# Patient Record
Sex: Male | Born: 1940 | Race: White | Hispanic: No | Marital: Married | Smoking: Former smoker
Health system: Southern US, Community
[De-identification: ages and names within clinical notes are randomized; demographics above are authoritative.]

## PROBLEM LIST (undated history)

## (undated) DIAGNOSIS — K2920 Alcoholic gastritis without bleeding: Secondary | ICD-10-CM

## (undated) DIAGNOSIS — Z860100 Personal history of colon polyps, unspecified: Secondary | ICD-10-CM

## (undated) DIAGNOSIS — K469 Unspecified abdominal hernia without obstruction or gangrene: Secondary | ICD-10-CM

## (undated) DIAGNOSIS — F0390 Unspecified dementia without behavioral disturbance: Secondary | ICD-10-CM

## (undated) DIAGNOSIS — C801 Malignant (primary) neoplasm, unspecified: Secondary | ICD-10-CM

## (undated) DIAGNOSIS — G20 Parkinson's disease: Secondary | ICD-10-CM

## (undated) DIAGNOSIS — IMO0002 Reserved for concepts with insufficient information to code with codable children: Secondary | ICD-10-CM

## (undated) HISTORY — DX: Unspecified abdominal hernia without obstruction or gangrene: K46.9

## (undated) HISTORY — DX: Reserved for concepts with insufficient information to code with codable children: IMO0002

## (undated) HISTORY — DX: Personal history of colon polyps, unspecified: Z86.0100

## (undated) HISTORY — DX: Personal history of colonic polyps: Z86.010

## (undated) HISTORY — DX: Malignant (primary) neoplasm, unspecified: C80.1

## (undated) HISTORY — DX: Alcoholic gastritis without bleeding: K29.20

## (undated) HISTORY — DX: Unspecified dementia, unspecified severity, without behavioral disturbance, psychotic disturbance, mood disturbance, and anxiety: F03.90

## (undated) HISTORY — DX: Parkinson's disease: G20

---

## 1992-03-24 HISTORY — PX: REPAIR OF PERFORATED ULCER: SHX6065

## 1998-03-24 HISTORY — PX: HERNIA REPAIR: SHX51

## 2002-03-24 HISTORY — PX: CHOLECYSTECTOMY: SHX55

## 2004-03-24 HISTORY — PX: POLYPECTOMY: SHX149

## 2004-05-07 ENCOUNTER — Ambulatory Visit: Payer: Self-pay | Admitting: General Surgery

## 2007-05-20 ENCOUNTER — Ambulatory Visit: Payer: Self-pay | Admitting: General Surgery

## 2010-03-24 HISTORY — PX: COLONOSCOPY: SHX174

## 2010-06-25 ENCOUNTER — Ambulatory Visit: Payer: Self-pay | Admitting: General Surgery

## 2010-07-04 LAB — PATHOLOGY REPORT

## 2011-02-22 DEATH — deceased

## 2011-03-25 DIAGNOSIS — C801 Malignant (primary) neoplasm, unspecified: Secondary | ICD-10-CM

## 2011-03-25 HISTORY — PX: SKIN CANCER EXCISION: SHX779

## 2011-03-25 HISTORY — DX: Malignant (primary) neoplasm, unspecified: C80.1

## 2011-08-23 HISTORY — PX: HEMICOLECTOMY: SHX854

## 2011-08-27 ENCOUNTER — Ambulatory Visit: Payer: Self-pay | Admitting: General Surgery

## 2011-09-02 ENCOUNTER — Ambulatory Visit: Payer: Self-pay | Admitting: General Surgery

## 2011-09-02 LAB — COMPREHENSIVE METABOLIC PANEL
Alkaline Phosphatase: 61 U/L (ref 50–136)
Anion Gap: 8 (ref 7–16)
Bilirubin,Total: 2.7 mg/dL — ABNORMAL HIGH (ref 0.2–1.0)
Calcium, Total: 8.8 mg/dL (ref 8.5–10.1)
Creatinine: 0.73 mg/dL (ref 0.60–1.30)
Potassium: 3.7 mmol/L (ref 3.5–5.1)
SGOT(AST): 11 U/L — ABNORMAL LOW (ref 15–37)
SGPT (ALT): 18 U/L
Sodium: 142 mmol/L (ref 136–145)
Total Protein: 6.4 g/dL (ref 6.4–8.2)

## 2011-09-02 LAB — CBC WITH DIFFERENTIAL/PLATELET
Basophil %: 0.4 %
Eosinophil #: 0.3 10*3/uL (ref 0.0–0.7)
Eosinophil %: 3.3 %
HGB: 15.1 g/dL (ref 13.0–18.0)
Lymphocyte #: 1.7 10*3/uL (ref 1.0–3.6)
MCHC: 34.6 g/dL (ref 32.0–36.0)
MCV: 95 fL (ref 80–100)
Monocyte #: 0.6 x10 3/mm (ref 0.2–1.0)
Monocyte %: 7.8 %
Platelet: 211 10*3/uL (ref 150–440)
RBC: 4.6 10*6/uL (ref 4.40–5.90)
RDW: 13.7 % (ref 11.5–14.5)
WBC: 7.7 10*3/uL (ref 3.8–10.6)

## 2011-09-02 LAB — PROTIME-INR: INR: 0.9

## 2011-09-02 LAB — BILIRUBIN, DIRECT: Bilirubin, Direct: 0.4 mg/dL — ABNORMAL HIGH (ref 0.00–0.20)

## 2011-09-05 ENCOUNTER — Inpatient Hospital Stay: Payer: Self-pay | Admitting: General Surgery

## 2011-09-05 DIAGNOSIS — Z860100 Personal history of colon polyps, unspecified: Secondary | ICD-10-CM | POA: Insufficient documentation

## 2011-09-07 LAB — CREATININE, SERUM
Creatinine: 0.76 mg/dL (ref 0.60–1.30)
EGFR (African American): >60
EGFR (Non-African Amer.): >60

## 2011-09-09 LAB — PATHOLOGY REPORT

## 2011-11-19 DIAGNOSIS — N411 Chronic prostatitis: Secondary | ICD-10-CM | POA: Insufficient documentation

## 2012-06-22 DIAGNOSIS — G20 Parkinson's disease: Secondary | ICD-10-CM

## 2012-06-22 DIAGNOSIS — G20A1 Parkinson's disease without dyskinesia, without mention of fluctuations: Secondary | ICD-10-CM

## 2012-06-22 HISTORY — DX: Parkinson's disease: G20

## 2012-06-22 HISTORY — DX: Parkinson's disease without dyskinesia, without mention of fluctuations: G20.A1

## 2012-10-28 ENCOUNTER — Encounter: Payer: Self-pay | Admitting: Neurology

## 2012-11-17 ENCOUNTER — Ambulatory Visit: Payer: Self-pay | Admitting: General Surgery

## 2012-11-22 ENCOUNTER — Encounter: Payer: Self-pay | Admitting: Neurology

## 2012-12-07 ENCOUNTER — Ambulatory Visit (INDEPENDENT_AMBULATORY_CARE_PROVIDER_SITE_OTHER): Payer: Medicare Other | Admitting: General Surgery

## 2012-12-07 ENCOUNTER — Encounter: Payer: Self-pay | Admitting: General Surgery

## 2012-12-07 VITALS — BP 102/68 | HR 74 | Resp 12 | Ht 69.0 in | Wt 179.0 lb

## 2012-12-07 DIAGNOSIS — K635 Polyp of colon: Secondary | ICD-10-CM

## 2012-12-07 DIAGNOSIS — Z860100 Personal history of colon polyps, unspecified: Secondary | ICD-10-CM

## 2012-12-07 DIAGNOSIS — D126 Benign neoplasm of colon, unspecified: Secondary | ICD-10-CM

## 2012-12-07 MED ORDER — POLYETHYLENE GLYCOL 3350 17 GM/SCOOP PO POWD: ORAL | Status: DC

## 2012-12-07 NOTE — Patient Instructions (Addendum)
Colonoscopy A colonoscopy is an exam to evaluate your entire colon. In this exam, your colon is cleansed. A long fiberoptic tube is inserted through your rectum and into your colon. The fiberoptic scope (endoscope) is a long bundle of enclosed and very flexible fibers. These fibers transmit light to the area examined and send images from that area to your caregiver. Discomfort is usually minimal. You may be given a drug to help you sleep (sedative) during or prior to the procedure. This exam helps to detect lumps (tumors), polyps, inflammation, and areas of bleeding. Your caregiver may also take a small piece of tissue (biopsy) that will be examined under a microscope. LET YOUR CAREGIVER KNOW ABOUT:   Allergies to food or medicine.  Medicines taken, including vitamins, herbs, eyedrops, over-the-counter medicines, and creams.  Use of steroids (by mouth or creams).  Previous problems with anesthetics or numbing medicines.  History of bleeding problems or blood clots.  Previous surgery.  Other health problems, including diabetes and kidney problems.  Possibility of pregnancy, if this applies. BEFORE THE PROCEDURE   A clear liquid diet may be required for 2 days before the exam.  Ask your caregiver about changing or stopping your regular medications.  Liquid injections (enemas) or laxatives may be required.  A large amount of electrolyte solution may be given to you to drink over a short period of time. This solution is used to clean out your colon.  You should be present 60 minutes prior to your procedure or as directed by your caregiver. AFTER THE PROCEDURE   If you received a sedative or pain relieving medication, you will need to arrange for someone to drive you home.  Occasionally, there is a little blood passed with the first bowel movement. Do not be concerned. FINDING OUT THE RESULTS OF YOUR TEST Not all test results are available during your visit. If your test results are  not back during the visit, make an appointment with your caregiver to find out the results. Do not assume everything is normal if you have not heard from your caregiver or the medical facility. It is important for you to follow up on all of your test results. HOME CARE INSTRUCTIONS   It is not unusual to pass moderate amounts of gas and experience mild abdominal cramping following the procedure. This is due to air being used to inflate your colon during the exam. Walking or a warm pack on your belly (abdomen) may help.  You may resume all normal meals and activities after sedatives and medicines have worn off.  Only take over-the-counter or prescription medicines for pain, discomfort, or fever as directed by your caregiver. Do not use aspirin or blood thinners if a biopsy was taken. Consult your caregiver for medicine usage if biopsies were taken. SEEK IMMEDIATE MEDICAL CARE IF:   You have a fever.  You pass large blood clots or fill a toilet with blood following the procedure. This may also occur 10 to 14 days following the procedure. This is more likely if a biopsy was taken.  You develop abdominal pain that keeps getting worse and cannot be relieved with medicine. Document Released: 03/07/2000 Document Revised: 06/02/2011 Document Reviewed: 10/21/2007 [facility] Patient Information 2014 [place], [place].  Patient has been scheduled for a colonoscopy on 01-11-13 at [facility].

## 2012-12-07 NOTE — Progress Notes (Signed)
Patient ID: Shane Larsson., male   DOB: June 25, 1940, 72 y.o.   MRN: [id number]  Chief Complaint  Patient presents with  . Other    colonoscopy    HPI Shane Mooney. is a 72 y.o. male here today for colonoscopy discussion.  He has a known history of colectomy with colon polyps. He denies any nausea, vomiting or diarrhea symptoms. He was recently diagnosed with parkinson disease and feels his neck is a little "swollen" over the past 2-3 weeks. Denies pain, trouble swallowing or trouble turning his head. Stools are described as regular and daily.  HPI   Past Medical History  Diagnosis Date  . Ulcer   . Hernia     LIH  . 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 Personal history of colonic polyps   . Parkinson disease April 2014    Past Surgical History  Procedure Laterality Date  . Colonoscopy  2012    Dr Lemar Livings  . Hernia repair  2000    LIH  . Repair of perforated ulcer  1994  . Cholecystectomy  2004  . Hemicolectomy Right 08/2011  . Skin cancer excision Left 2013    neck  . Polypectomy  2006     benign  . Colectomy      No family history on file.  Social History History  Substance Use Topics  . Smoking status: Former Smoker -- 0.50 packs/day for 10 years    Types: Cigarettes  . Smokeless tobacco: Never Used  . Alcohol Use: Yes    No Known Allergies  Current Outpatient Prescriptions  Medication Sig Dispense Refill  . AZILECT 1 MG TABS tablet Take 1 tablet by mouth daily.      . polyethylene glycol powder (GLYCOLAX/MIRALAX) powder 255 grams one bottle for colonoscopy prep  255 g  0   No current facility-administered medications for this visit.    Review of Systems Review of Systems  Constitutional: Negative.   Respiratory: Negative.   Cardiovascular: Negative.     Blood pressure 102/68, pulse 74, resp. rate 12, height 5\' 9"  (1.753 m), weight 179 lb (81.194 kg).  Physical  Exam Physical Exam  Constitutional: He is oriented to person, place, and time. He appears well-developed and well-nourished.  Neck: Neck supple. No thyromegaly present.  Cardiovascular: Normal rate and regular rhythm.   Pulmonary/Chest: Effort normal and breath sounds normal.  Abdominal: Soft. No hernia.  Lymphadenopathy:    He has no cervical adenopathy.  Neurological: He is alert and oriented to person, place, and time.  Skin: Skin is warm and dry.    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 No regional nodal adenopathy. No abnormality of the appendix.  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     Plan    Followup colonoscopies been recommended to assess for recurrent polyps.     Patient has been scheduled for a colonoscopy on 01-11-13 at [facility].   Shane Mooney 12/09/2012, 5:26 AM

## 2012-12-09 ENCOUNTER — Encounter: Payer: Self-pay | Admitting: General Surgery

## 2013-01-01 ENCOUNTER — Other Ambulatory Visit: Payer: Self-pay | Admitting: General Surgery

## 2013-01-01 DIAGNOSIS — Z860100 Personal history of colon polyps, unspecified: Secondary | ICD-10-CM

## 2013-01-03 ENCOUNTER — Telehealth: Payer: Self-pay | Admitting: *Deleted

## 2013-01-03 NOTE — Telephone Encounter (Signed)
Patient was offered to move colonoscopy from 01-11-13 to 01-05-13 at [facility] [place]. This patient declines due to things he already has scheduled. Patient reports he has already pre-registered and has Miralax prescription. He was instructed to call the office if he has further questions.

## 2013-01-11 ENCOUNTER — Ambulatory Visit: Payer: Self-pay | Admitting: General Surgery

## 2013-01-11 DIAGNOSIS — Z86010 Personal history of colonic polyps: Secondary | ICD-10-CM

## 2013-01-12 ENCOUNTER — Encounter: Payer: Self-pay | Admitting: General Surgery

## 2013-01-13 LAB — PATHOLOGY REPORT

## 2013-01-14 ENCOUNTER — Encounter: Payer: Self-pay | Admitting: General Surgery

## 2013-01-18 ENCOUNTER — Telehealth: Payer: Self-pay | Admitting: *Deleted

## 2013-01-18 NOTE — Telephone Encounter (Signed)
Message copied by Currie Paris on Tue Jan 18, 2013  8:14 AM ------      Message from: [place], [place]      Created: Tue Jan 18, 2013  7:53 AM         Please notify wife/ pt that polyps fine. Repeat exam in five years. Thanks.      ----- Message -----         From: Jena Gauss, CMA         Sent: 01/14/2013   8:49 AM           To: Earline Mayotte, MD                   ------

## 2013-01-18 NOTE — Telephone Encounter (Signed)
Notified patient wife as instructed, patient pleased. Discussed follow-up appointments as needed, patient wife agrees. Placed in recalls for 5 years, wife agrees.

## 2013-01-25 ENCOUNTER — Encounter: Payer: Self-pay | Admitting: General Surgery

## 2013-01-26 ENCOUNTER — Encounter: Payer: Self-pay | Admitting: General Surgery

## 2013-02-23 ENCOUNTER — Ambulatory Visit: Payer: Self-pay | Admitting: Neurology

## 2014-01-10 DIAGNOSIS — H9193 Unspecified hearing loss, bilateral: Secondary | ICD-10-CM | POA: Insufficient documentation

## 2014-01-10 DIAGNOSIS — G20A1 Parkinson's disease without dyskinesia, without mention of fluctuations: Secondary | ICD-10-CM | POA: Insufficient documentation

## 2014-01-10 DIAGNOSIS — G20 Parkinson's disease: Secondary | ICD-10-CM | POA: Insufficient documentation

## 2014-05-09 DIAGNOSIS — R443 Hallucinations, unspecified: Secondary | ICD-10-CM | POA: Insufficient documentation

## 2014-05-10 DIAGNOSIS — G3183 Dementia with Lewy bodies: Secondary | ICD-10-CM

## 2014-05-10 DIAGNOSIS — F0280 Dementia in other diseases classified elsewhere without behavioral disturbance: Secondary | ICD-10-CM | POA: Insufficient documentation

## 2014-06-05 ENCOUNTER — Encounter: Payer: Self-pay | Admitting: Neurology

## 2014-06-05 ENCOUNTER — Ambulatory Visit (INDEPENDENT_AMBULATORY_CARE_PROVIDER_SITE_OTHER): Payer: Medicare Other | Admitting: Neurology

## 2014-06-05 ENCOUNTER — Telehealth: Payer: Self-pay | Admitting: Neurology

## 2014-06-05 VITALS — BP 116/70 | HR 88 | Ht 70.0 in | Wt 172.0 lb

## 2014-06-05 DIAGNOSIS — F0280 Dementia in other diseases classified elsewhere without behavioral disturbance: Secondary | ICD-10-CM

## 2014-06-05 DIAGNOSIS — R5382 Chronic fatigue, unspecified: Secondary | ICD-10-CM

## 2014-06-05 DIAGNOSIS — G20 Parkinson's disease: Secondary | ICD-10-CM

## 2014-06-05 DIAGNOSIS — G20A1 Parkinson's disease without dyskinesia, without mention of fluctuations: Secondary | ICD-10-CM

## 2014-06-05 LAB — CBC WITH DIFFERENTIAL/PLATELET
BASOS PCT: 0 % (ref 0–1)
Basophils Absolute: 0 10*3/uL (ref 0.0–0.1)
EOS PCT: 1 % (ref 0–5)
Eosinophils Absolute: 0.1 10*3/uL (ref 0.0–0.7)
HCT: 42 % (ref 39.0–52.0)
HEMOGLOBIN: 14.2 g/dL (ref 13.0–17.0)
LYMPHS ABS: 1.3 10*3/uL (ref 0.7–4.0)
Lymphocytes Relative: 16 % (ref 12–46)
MCH: 30.7 pg (ref 26.0–34.0)
MCHC: 33.8 g/dL (ref 30.0–36.0)
MCV: 90.9 fL (ref 78.0–100.0)
MPV: 10.8 fL (ref 8.6–12.4)
Monocytes Absolute: 0.8 10*3/uL (ref 0.1–1.0)
Monocytes Relative: 9 % (ref 3–12)
NEUTROS PCT: 74 % (ref 43–77)
Neutro Abs: 6.2 10*3/uL (ref 1.7–7.7)
Platelets: 267 10*3/uL (ref 150–400)
RBC: 4.62 MIL/uL (ref 4.22–5.81)
RDW: 13.9 % (ref 11.5–15.5)
WBC: 8.4 10*3/uL (ref 4.0–10.5)

## 2014-06-05 LAB — COMPREHENSIVE METABOLIC PANEL
ALT: 13 U/L (ref 0–53)
AST: 13 U/L (ref 0–37)
Albumin: 4 g/dL (ref 3.5–5.2)
Alkaline Phosphatase: 60 U/L (ref 39–117)
BILIRUBIN TOTAL: 3.1 mg/dL — AB (ref 0.2–1.2)
BUN: 17 mg/dL (ref 6–23)
CHLORIDE: 105 meq/L (ref 96–112)
CO2: 26 mEq/L (ref 19–32)
Calcium: 9.2 mg/dL (ref 8.4–10.5)
Creat: 0.91 mg/dL (ref 0.50–1.35)
Glucose, Bld: 78 mg/dL (ref 70–99)
Potassium: 4.2 mEq/L (ref 3.5–5.3)
SODIUM: 142 meq/L (ref 135–145)
TOTAL PROTEIN: 6.1 g/dL (ref 6.0–8.3)

## 2014-06-05 MED ORDER — QUAD CANE MISC: 1.0000 | Freq: Every day | Status: DC

## 2014-06-05 NOTE — Progress Notes (Signed)
Shane Mooney. was seen today in the movement disorders clinic for neurologic consultation at the request of Dr. Melrose Nakayama.  His PCP is Christianne Borrow, MD.  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  He is accompanied by his wife who supplements the history.   I have notes from Dr. Melrose Nakayama, although they aren't from the onset of his relationship with Dr. Melrose Nakayama.  Pts wife indicates that he began to see Dr. Melrose Nakayama about 3 years ago.  He was shuffling and had tremor and those were the first sx's that got him to Dr. Melrose Nakayama.  He was dx with Parkinsons disease.  He did have memory change back then and was dx with dementia as well and placed on aricept as well as carbidopa/levodopa 25/100 and requip.  It seemed to initally help the walking but he began to have prominent hallucinations in December of 2015, at which point his azilect and requip were d/c but didn't make much of a difference in hallucinations.  He didn't realize that the visual hallucinations weren't real and christmas decorations seemed to make things worse ([place], the lights).  He is seeing little girls in the bedroom and hates to change in his bedroom because of this.  He told her that he didn't know his own bedroom and wanted to know how to get to bed.  Seroquel was started on 05/31/14, 25 mg, 1/2 po bid.  His wife hasn't noted any difference yet with this but the patient thinks that this may have helped.  He is having trouble focusing.  He doesn't keep active much, but he does read some and does play some solitaire.  His wife does think that when his meds were d/c, his shuffling got worse.    Specific Symptoms:  Tremor: Yes.   (no per pt but yes per wife - R hand) Voice: softer than usual Sleep: "off and on"  Vivid Dreams:  No.  Acting out dreams:  No. Wet Pillows: Yes.   Postural symptoms:  Yes.    Falls?  No. Bradykinesia symptoms: shuffling gait, slow movements and difficulty getting out of a chair Loss of smell:   Yes.   Loss of taste:  No. Urinary Incontinence:  No. Difficulty Swallowing:  Yes.   (just a little with pills) Handwriting, micrographia: Yes.   Trouble with ADL's:  Yes.    (minimally so; trouble getting undressed and putting in hearing aid)  Trouble buttoning clothing: No. Depression:  No. Memory changes:  Yes.   Hallucinations:  Yes.    visual distortions: Yes.   N/V:  No. Lightheaded:  No.  Syncope: No. Diplopia:  Yes.   (only happened when changed to new glasses and went back to old ones it was fine and none when glasses off) Dyskinesia:  No.  Neuroimaging has previously been performed.  It is not available for my review today.  PREVIOUS MEDICATIONS: Sinemet, Requip and azilect  ALLERGIES:  No Known Allergies  CURRENT MEDICATIONS:  Outpatient Encounter Prescriptions as of 06/05/2014  Medication Sig  . Cholecalciferol (VITAMIN D PO) Take 2,000 Units by mouth daily.  . Cyanocobalamin (VITAMIN B-12 PO) Take 1,000 mcg by mouth daily.  . QUEtiapine (SEROQUEL) 25 MG tablet Take 12.5 mg by mouth 2 (two) times daily.   . [DISCONTINUED] AZILECT 1 MG TABS tablet Take 1 tablet by mouth daily.  . [DISCONTINUED] polyethylene glycol powder (GLYCOLAX/MIRALAX) powder 255 grams one bottle for colonoscopy prep    PAST MEDICAL HISTORY:  Past Medical History  Diagnosis Date  . Ulcer   . Hernia     LIH  . 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 Personal history of colonic polyps   . Parkinson disease April 2014  . Dementia     PAST SURGICAL HISTORY:   Past Surgical History  Procedure Laterality Date  . Colonoscopy  2012    Dr Bary Castilla  . Hernia repair  2000    LIH  . Repair of perforated ulcer  1994  . Cholecystectomy  2004  . Hemicolectomy Right 08/2011  . Skin cancer excision Left 2013    neck  . Polypectomy  2006     benign    SOCIAL HISTORY:   History   Social History  . Marital Status: Married      Spouse Name: N/A  . Number of Children: N/A  . Years of Education: N/A   Occupational History  . Not on file.   Social History Main Topics  . Smoking status: Former Smoker -- 0.50 packs/day for 10 years    Types: Cigarettes    Quit date: 06/05/1974  . Smokeless tobacco: Never Used  . Alcohol Use: No  . Drug Use: No  . Sexual Activity: Not on file   Other Topics Concern  . Not on file   Social History Narrative    FAMILY HISTORY:   Family Status  Relation Status Death Age  . Mother Deceased     stroke  . Father Deceased     colon cancer  . Brother      adopted  . Son Alive     healthy  . Daughter Alive     healthy    ROS:  A complete 10 system review of systems was obtained and was unremarkable apart from what is mentioned above.  PHYSICAL EXAMINATION:    VITALS:   Filed Vitals:   06/05/14 1407  BP: 116/70  Pulse: 88  Height: 5\' 10"  (1.778 m)  Weight: 172 lb (78.019 kg)    GEN:  The patient appears stated age and is in NAD. HEENT:  Normocephalic, atraumatic.  The mucous membranes are moist. The superficial temporal arteries are without ropiness or tenderness. CV:  RRR Lungs:  CTAB Neck/HEME:  There are no carotid bruits bilaterally.  Neurological examination:  Orientation:  Montreal Cognitive Assessment  06/05/2014  Visuospatial/ Executive (0/5) 1  Naming (0/3) 3  Attention: Read list of digits (0/2) 2  Attention: Read list of letters (0/1) 0  Attention: Serial 7 subtraction starting at 100 (0/3) 1  Language: Repeat phrase (0/2) 1  Language : Fluency (0/1) 1  Abstraction (0/2) 1  Delayed Recall (0/5) 3  Orientation (0/6) 2  Total 15  Adjusted Score (based on education) 15    Cranial nerves: There is good facial symmetry.  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  Pupils are equal round and reactive to light bilaterally. Fundoscopic exam reveals clear margins bilaterally. Extraocular muscles are intact.  He has difficulty participating with formal visual  field testing.  He blinks to visual menace bilaterally.  The speech is fluent and clear.  He is hypophonic.  Soft palate rises symmetrically and there is no tongue deviation. Hearing is decreased to conversational tone. Sensation: Sensation is intact to light and pinprick throughout (facial, trunk, extremities). Vibration is intact at the bilateral big toe. There is no extinction with double simultaneous stimulation. There is  no sensory dermatomal level identified. Motor: Strength is at least 4/5 in the bilateral upper and lower extremities.  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 Deep tendon reflexes: Deep tendon reflexes are 2-/4 at the bilateral biceps, triceps, brachioradialis, patella and achilles. Plantar responses are downgoing bilaterally.  Movement examination: Tone: There is slight increased tone in the RUE.  There is normal tone but gegenhalten in the LUE.    The tone in the lower extremities is normal.  Abnormal movements: There is some RUE resting tremor Coordination:  There is minimal decremation with RAM's, seen most prominently with hand opening and closing on the left. Gait and Station: The patient has mild difficulty arising out of a deep-seated chair without the use of the hands. The patient's stride length is markedly decreased, with stooped posture and shuffling gait.  He turns en bloc.  Armswing is decreased.  ASSESSMENT/PLAN:  1.  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  The diagnosis of Lewy body dementia has now come into question, but I suspect that we are just seeing a progression of the Parkinson's disease.  However, I do not have his notes from his initial diagnosis, so I cannot be exactly sure of this.  It does appear that he did have some dementia at diagnosis  as he used to be on Aricept.  Nonetheless, they are clear that the visual hallucinations did not start until December, 2015 and he was diagnosed with Parkinson's 3 years ago.  I will try to get those records again from Dr. Weber Cooks office.  -I agree with d/c of requip and azilect but he probable needs the levodopa back.  He has just started having to use a cane for the first time ever after the discontinuation of levodopa.  His wife thinks that perhaps levodopa in the past caused stomach upset, but they are not sure.  He never had the stomach upset until Azilect was started.  I think I would recommend restarting the levodopa at half a tablet 3 times per day, but would wait to do this until they get the visual hallucinations under a little better control. 2.  Visual hallucinations.  -Apparently his UA was checked in December, but has not had other lab work in a year.  Would recommend checking his chemistry, CBC, TSH.    -increase Seroquel, 25 mg from half tablet twice a day to half tablet in the morning and 1 at night for a week and then if hallucinations are still present he can go to 1 tablet twice a day.  We did talk about the fact that the atypical antipsychotic medications are not indicated for dementia related psychosis and increased risk of mortality in the elderly, usually because of infectious or  cardiac related. Understanding is expressed and they were agreeable that the benefits outweigh the risks in this case. 3.  Dementia  -needs 24 hour per day care  -info given on home instead  -[facility] as they just had PT   -order written for 4 pronged cane 4.  Much greater than 50% of this visit was spent in counseling with the patient and the family.  Total face to face time:  60 min

## 2014-06-05 NOTE — Telephone Encounter (Signed)
Referral to [facility] for Parkinson's Program PT/OT/ST faxed to [phone] with confirmation received. They will contact patient.

## 2014-06-05 NOTE — Patient Instructions (Addendum)
1. Your provider has requested that you have labwork completed today. Please go to [facility] on the first floor of this building before leaving the office today. 2. Increase Seroquel to half tablet in the morning, whole tablet at night, for one week. If still having hallucinations you can increase to one tablet twice daily. We will call in 2 weeks to see how you are doing.

## 2014-06-05 NOTE — Telephone Encounter (Signed)
This encounter was created in error - please disregard.

## 2014-06-06 LAB — TSH: TSH: 1.172 u[IU]/mL (ref 0.350–4.500)

## 2014-06-13 ENCOUNTER — Telehealth: Payer: Self-pay | Admitting: Neurology

## 2014-06-13 NOTE — Telephone Encounter (Signed)
Luvenia Starch, can you call them please

## 2014-06-13 NOTE — Telephone Encounter (Signed)
Pt wife [name] called and would like to speak to someone about husband blood pressure please call [phone]

## 2014-06-13 NOTE — Telephone Encounter (Signed)
Spoke with patient's wife. She states blood pressure has been dropping to less than 100 (top number) and she is concerned. Made her aware that this is okay. She will let us know if BP is running consistently under 90/60. He is still very confrontational. She states patient still having hallucinations - they increased Seroquel to 1 tablet BID on Monday and will let us know next week if this gets any better.

## 2014-06-19 ENCOUNTER — Telehealth: Payer: Self-pay | Admitting: Neurology

## 2014-06-19 NOTE — Telephone Encounter (Signed)
Left message on machine for patient to call back.

## 2014-06-19 NOTE — Telephone Encounter (Signed)
[name], patient's wife, called to let us know that patient has been on Seroquel twice daily for a week. She states hallucinations are better but still present. She does not want to increase Seroquel because she thinks it is making him restless and feels like they can maintain with this level of hallucinations. Patient is falling down more. She asked if they should go ahead and re-start Carbidopa Levodopa. Patient still has 25/100 IR tablets at home. They were made aware that Dr Tat is out of the office this week and she will probably decide this when she returns, but that I would send a message to her partners to advise.

## 2014-06-19 NOTE — Telephone Encounter (Signed)
Per clinic note, if visual hallucinations are better, ok to restart sinemet half tablet three times daily.    Donika K. Posey Pronto, DO

## 2014-06-20 NOTE — Telephone Encounter (Signed)
Patient's wife made aware to go ahead and start Levodopa at 1/2 tablet TID. They already have RX from previously taking medication. She will let me know in about a week how he is doing on medication and call with any other problems.

## 2014-06-26 ENCOUNTER — Telehealth: Payer: Self-pay | Admitting: Neurology

## 2014-06-26 NOTE — Telephone Encounter (Signed)
Spoke with Rodman Key, RN from [facility], he states patient has developed a low grade fever, decreased appetite, weakness and lose bowels. I instructed him to have patient/family follow up with PCP regarding symptoms.

## 2014-06-27 ENCOUNTER — Telehealth: Payer: Self-pay | Admitting: Neurology

## 2014-06-27 NOTE — Telephone Encounter (Signed)
Lab results faxed to Dr Ancil Boozer at [phone] with confirmation received.

## 2014-06-27 NOTE — Telephone Encounter (Signed)
[name], Pt's wife called wanting for pt's LAB results from 06/05/14 to be faxed over this morning Dr. Ancil Boozer from [facility] in [place].  Fax# [phone] C/b [phone]

## 2014-07-03 ENCOUNTER — Other Ambulatory Visit: Payer: Self-pay | Admitting: Neurology

## 2014-07-03 MED ORDER — QUETIAPINE FUMARATE 25 MG PO TABS: 25.0000 mg | ORAL_TABLET | Freq: Two times a day (BID) | ORAL | Status: DC

## 2014-07-03 NOTE — Telephone Encounter (Signed)
Note received from [facility] for updated RX for Seroquel 25 mg - take one tablet BID. RX printed, signed and faxed to [facility] at [phone] with confirmation received.

## 2014-07-16 NOTE — Op Note (Signed)
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  OPERATIVE PROCEDURE: Hand-assisted laparoscopic resection of the right colon with ileocolic anastomosis.   SURGEON: Hervey Ard, MD    ASSISTANT: Mckinley Jewel, MD   ANESTHESIA: General endotracheal.   ANESTHESIOLOGIST:  Dr. Whitman Hero BLOOD LOSS: 25 mL.   CLINICAL NOTE: This 74 year old male has had multiple polyps in the right colon including both villous adenomas and tubular adenomas. He has a lesion at the hepatic flexure on the back of a fold that has been impossible to completely resect. It has grown in size since his exam one year ago, and he was recommended to undergo right hemicolectomy. The area had been inked at the time of his most recent colonoscopy.   The patient had suffered a perforated ulcer in 1994, and at the time of attempted laparoscopic cholecystectomy approximately a decade ago extensive adhesions in the right upper quadrant had precluded laparoscopic cholecystectomy. He had, therefore, had both an upper midline and a right subcostal incision. The possibility of an open procedure had been reviewed with the patient and his wife.   OPERATIVE NOTE: With the patient under adequate general endotracheal anesthesia, a Foley catheter was placed by the nurse and the abdomen prepped with ChloraPrep and draped. The patient received Invanz prior to the procedure. A bowel prep was not utilized. A Bair Hugger was in place. A 7 cm incision just above and extending below the umbilicus was made and carried down through the skin and subcutaneous tissue. Hemostasis was with electrocautery. The fascia was incised with cutting current. After entering the abdomen, there appeared to be very few adhesions by palpation. A 10  mm scope was then advanced adjacent to the surgeon's hand, and inspection showed the abdomen to be fairly free of adhesive disease. It was thought to be possible to complete a laparoscopically-assisted resection. A hand port was placed, and an 11 mm Xcel port placed in the left upper quadrant under direct vision, and a similar port placed in the midline after release of adhesions from the omentum to the anterior abdominal wall. The previously inked colon was at the hepatic flexure as evident at the time of his colonoscopy. The omentum was dissected free from the colon beginning to the left of the midline and extending to the hepatic flexure. The white line of Toldt was then divided and the colon mobilized to the midline. The duodenum was visualized and protected. After complete mobilization of the right colon, the pneumoperitoneum was released; and with the hand port in place the right colon was brought easily through the wound. The mesentery was taken down with the Harmonic scalpel with the exception of the right branch of the middle colic and the right colic artery, which were controlled with 2-0 silk ties. A side-to-side functional end-to-end anastomosis was completed making use of the 75 mm GIA stapler with two applications. The anastomosis palpated to three fingerbreadths. The mesenteric defect was closed with a running 3-0 Vicryl. The corners of the anastomosis were reinforced with 3-0 silk seromuscular sutures. The surgeon's gloves were changed. Pneumoperitoneum was re-established and inspection showed no evidence of injury. Sponge, tape and instrument count was correct. The fascia at the midline was closed in two layers. A running 0 Vicryl suture was used for  the peritoneum posterior rectus sheath and interrupted 0 Prolene figure-of-eight sutures used for the anterior rectus sheath and midline fascia. Skin incisions were closed with staples. The adipose layer for the primary incision was approximated with  running 2-0 Vicryl prior to staple skin closure. Dry dressings were applied. The patient was taken to the recovery room in stable condition. The Foley catheter was left in place due to his history of benign prostatic hypertrophy with nocturia.   ____________________________ Eliott Bellow, MD jwb:cbb D: 09/05/2011 17:06:58 ET T: 09/06/2011 10:21:05 ET JOB#: [id number]  cc: La Bellow, MD, <Dictator> Bethena Roys. Ancil Boozer, MD Sadie Pickar Amedeo Kinsman MD ELECTRONICALLY SIGNED 09/06/2011 21:16

## 2014-07-16 NOTE — Discharge Summary (Signed)
PATIENT NAME:  Shane Mooney, Shane Mooney MR#:  [id number] DATE OF BIRTH:  02/05/1941  DATE OF ADMISSION:  09/05/2011 DATE OF DISCHARGE:  09/08/2011  DISCHARGE DIAGNOSIS: Villous adenoma of the hepatic flexure.   CLINICAL NOTE: This 74 year old male had a villous adenoma identified in the hepatic flexure that had been nonresectable with the endoscope. He was felt to be a candidate for elective right hemicolectomy.   The patient was admitted the day of surgery. He underwent a laparoscopically assisted right hemicolectomy.   Estimated blood loss from the procedure was 50 milliliters. His postoperative course was unremarkable. He was able to tolerate clear liquids on the day of surgery and this was advanced to a soft diet. He ambulated early and often. His cardiopulmonary examination remained clear. He showed no evidence of alcohol withdrawal during his hospitalization.   SUMMARY OF LABORATORY STUDIES: Pathology showed a tubulovillous adenoma measuring 2.5 cm in diameter at the hepatic flexure. The appendix was unremarkable. Twelve lymph nodes were unremarkable. Additional small tubular adenoma was identified in the colon. Admission hemoglobin 15.1 with an MCV of 95, white blood cell count 7,700. 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 Alkaline phosphatase 61, SGOT 11, SGPT 18, total protein 6.4, albumin 3.6. Electrolytes were normal. Minimal elevation of nonfasting blood sugar at 109. Normal renal function with a creatinine of 0.7 and an estimated GFR of greater than 60.   The patient was discharged home with arrangements to be made for follow-up in my office within the week for staple removal. He was given a prescription for hydrocodone as needed for pain.   ____________________________ Kaycee Bellow, MD jwb:ap D: 09/25/2011 19:39:58 ET            T: 09/26/2011 14:07:53 ET            JOB#: [id number] cc: Ashok Norris,  MD Avrielle Fry Amedeo Kinsman MD ELECTRONICALLY SIGNED 09/29/2011 11:20

## 2014-07-24 ENCOUNTER — Encounter: Payer: Self-pay | Admitting: Neurology

## 2014-07-25 ENCOUNTER — Telehealth: Payer: Self-pay | Admitting: Neurology

## 2014-07-25 NOTE — Telephone Encounter (Signed)
Melissa welch from [place] called and states that the patient wife declined them coming out due the daughter helping put with [facility] phone number is [phone]

## 2014-07-25 NOTE — Telephone Encounter (Signed)
FYI

## 2014-07-26 ENCOUNTER — Ambulatory Visit (INDEPENDENT_AMBULATORY_CARE_PROVIDER_SITE_OTHER): Payer: Medicare Other | Admitting: Neurology

## 2014-07-26 ENCOUNTER — Encounter: Payer: Self-pay | Admitting: Neurology

## 2014-07-26 VITALS — BP 108/70 | HR 76 | Ht 70.0 in | Wt 164.0 lb

## 2014-07-26 DIAGNOSIS — G20 Parkinson's disease: Secondary | ICD-10-CM | POA: Diagnosis not present

## 2014-07-26 DIAGNOSIS — F0280 Dementia in other diseases classified elsewhere without behavioral disturbance: Secondary | ICD-10-CM

## 2014-07-26 DIAGNOSIS — R443 Hallucinations, unspecified: Secondary | ICD-10-CM

## 2014-07-26 DIAGNOSIS — G9340 Encephalopathy, unspecified: Secondary | ICD-10-CM | POA: Diagnosis not present

## 2014-07-26 MED ORDER — TRANSPORT CHAIR MISC: 1.0000 | Freq: Every day | Status: DC

## 2014-07-26 MED ORDER — RIVASTIGMINE 9.5 MG/24HR TD PT24: 9.5000 mg | MEDICATED_PATCH | Freq: Every day | TRANSDERMAL | Status: DC

## 2014-07-26 MED ORDER — RIVASTIGMINE 4.6 MG/24HR TD PT24: 4.6000 mg | MEDICATED_PATCH | Freq: Every day | TRANSDERMAL | Status: DC

## 2014-07-26 NOTE — Patient Instructions (Addendum)
1. You can increase Melatonin to 5 mg at night if needed.  2. Start Exelon patches. Use 4.6 mg patches for one month then increase to 9.5 mg patches. Both prescriptions sent to pharmacy.  3. Look for lactose free ensure.  4. Prescription for transport chair given.  5. See attached information about tracking bracelet.  6. We will call you with an appt for your MR.

## 2014-07-26 NOTE — Progress Notes (Signed)
Shane Mooney. was seen today in the movement disorders clinic for neurologic consultation at the request of Shane Mooney.  His PCP is Shane Borrow, MD.  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  He is accompanied by his wife who supplements the history.   I have notes from Shane Mooney, although they aren't from the onset of his relationship with Shane Mooney.  Pts wife indicates that he began to see Shane Mooney about 3 years ago.  He was shuffling and had tremor and those were the first sx's that got him to Shane Mooney.  He was dx with Parkinsons disease.  He did have memory change back then and was dx with dementia as well and placed on aricept as well as carbidopa/levodopa 25/100 and requip.  It seemed to initally help the walking but he began to have prominent hallucinations in December of 2015, at which point his azilect and requip were d/c but didn't make much of a difference in hallucinations.  He didn't realize that the visual hallucinations weren't real and christmas decorations seemed to make things worse ([place], the lights).  He is seeing little girls in the bedroom and hates to change in his bedroom because of this.  He told her that he didn't know his own bedroom and wanted to know how to get to bed.  Seroquel was started on 05/31/14, 25 mg, 1/2 po bid.  His wife hasn't noted any difference yet with this but the patient thinks that this may have helped.  He is having trouble focusing.  He doesn't keep active much, but he does read some and does play some solitaire.  His wife does think that when his meds were d/c, his shuffling got worse.    07/26/14 update:  The patient is following up today, accompanied by his wife who supplements the history.  Since last visit, I was able to get a copy of Shane Mooney records.  He began to see the patient in November, 2013.  At that time, the patient was experiencing tremor in the right leg and hand for about 5 years.  At the time that he  saw Shane Mooney in 2013 the patient was complaining about some memory changes by his MMSE was 29/30.  He was started on levodopa at that first visit which was 02/17/2012.  He followed up with Shane Mooney on 03/09/2012 and physically and mentally the patient felt better.  A note on April, 2014 reported that the patient felt lightheaded and the patient had discontinued levodopa and started Azilect.  In November, 2014 there were notes regarding mild cognitive impairment versus early dementia and the patient had started on Aricept.  In April, 2015 his MMSE was 30/30.  Since our last visit, the patient has increased his quetiapine to 25 mg twice a day (am and dinner).  He has restarted his carbidopa/levodopa 25/100, half a tablet 3 times per day (30 minutes before the meals).  His wife states that they have had a few incidents where he has not been able to recognize his wife; wanted proof that they were married.  His wife states that he no longer realizes that hallucinations weren't real and he will pick "mushrooms" off the floor.  Wife thinks he sees people and animals as well.  Not sleeping well at night.  Wife would like to consider discontinuing Seroquel as the hallucinations are really not bothering either of them.  Losing things because he is hiding them  and losing them.  Wife has a baby monitor.  Has door alarm and pt thinks that it keeps "two little girls out of the house."  He is losing weight; he eats candy.  He had diarrhea with ensure because he is lactose intolerant.  His wife asks about a GPS tracker for him.  He has had care self come into the home and that has been very beneficial.  His daughter is also going to be quitting her job and helping to caregiver about 4 days per week.  He had lab work done to include a normal CBC and a normal TSH of 1.172.  His chemistry was normal with the exception of an elevated total bilirubin of 3.1.  Review of old records indicates that patient had had symptoms since  2008, but had no memory complaints until at least 2013, but MMSE remained 30/30 even in 2015.  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  Neuroimaging has previously been performed.  It is not available for my review today.  PREVIOUS MEDICATIONS: Sinemet, Requip and azilect  ALLERGIES:  No Known Allergies  CURRENT MEDICATIONS:  Outpatient Encounter Prescriptions as of 07/26/2014  Medication Sig  . carbidopa-levodopa (SINEMET IR) 25-100 MG per tablet Take 1 tablet by mouth 3 (three) times daily.  . Cholecalciferol (VITAMIN D PO) Take 2,000 Units by mouth daily.  . Cyanocobalamin (VITAMIN B-12 PO) Take 1,000 mcg by mouth daily.  . Melatonin 3 MG CAPS Take by mouth at bedtime.  . Misc. Devices (QUAD CANE) MISC 1 Device by Does not apply route daily.  . QUEtiapine (SEROQUEL) 25 MG tablet Take 1 tablet (25 mg total) by mouth 2 (two) times daily.   No facility-administered encounter medications on file as of 07/26/2014.    PAST MEDICAL HISTORY:   Past Medical History  Diagnosis Date  . Ulcer   . Hernia     [facility]  . 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 Personal history of colonic polyps   . Parkinson disease April 2014  . Dementia     PAST SURGICAL HISTORY:   Past Surgical History  Procedure Laterality Date  . Colonoscopy  2012    Dr Shane Mooney  . Hernia repair  2000    [facility]  . Repair of perforated ulcer  1994  . Cholecystectomy  2004  . Hemicolectomy Right 08/2011  . Skin cancer excision Left 2013    neck  . Polypectomy  2006     benign    SOCIAL HISTORY:   History   Social History  . Marital Status: Married    Spouse Name: N/A  . Number of Children: N/A  . Years of Education: N/A   Occupational History  . Not on file.   Social History Main Topics  . Smoking status: Former Smoker -- 0.50 packs/day for 10 years    Types:  Cigarettes    Quit date: 06/05/1974  . Smokeless tobacco: Never Used  . Alcohol Use: No  . Drug Use: No  . Sexual Activity: Not on file   Other Topics Concern  . Not on file   Social History Narrative    FAMILY HISTORY:   Family Status  Relation Status Death Age  . Mother Deceased     stroke  . Father Deceased     colon cancer  . Brother  adopted  . Son Alive     healthy  . Daughter Alive     healthy    ROS:  A complete 10 system review of systems was obtained and was unremarkable apart from what is mentioned above.  PHYSICAL EXAMINATION:    VITALS:   Filed Vitals:   07/26/14 0925  BP: 108/70  Pulse: 76  Height: 5\' 10"  (1.778 m)  Weight: 164 lb (74.39 kg)   Wt Readings from Last 3 Encounters:  07/26/14 164 lb (74.39 kg)  06/05/14 172 lb (78.019 kg)  12/07/12 179 lb (81.194 kg)     GEN:  The patient appears stated age and is in NAD. HEENT:  Normocephalic, atraumatic.  The mucous membranes are moist. The superficial temporal arteries are without ropiness or tenderness. CV:  RRR Lungs:  CTAB Neck/HEME:  There are no carotid bruits bilaterally.  Neurological examination:  Orientation:  Montreal Cognitive Assessment  06/05/2014  Visuospatial/ Executive (0/5) 1  Naming (0/3) 3  Attention: Read list of digits (0/2) 2  Attention: Read list of letters (0/1) 0  Attention: Serial 7 subtraction starting at 100 (0/3) 1  Language: Repeat phrase (0/2) 1  Language : Fluency (0/1) 1  Abstraction (0/2) 1  Delayed Recall (0/5) 3  Orientation (0/6) 2  Total 15  Adjusted Score (based on education) 15    Cranial nerves: There is good facial symmetry.  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  Pupils are equal round and reactive to light bilaterally. Fundoscopic exam reveals clear margins bilaterally. Extraocular muscles are intact.  He has difficulty participating with formal visual field testing.  He blinks to visual menace bilaterally.  The speech is fluent and clear.  He is  hypophonic.  Soft palate rises symmetrically and there is no tongue deviation. Hearing is decreased to conversational tone. Sensation: Sensation is intact to light and pinprick throughout (facial, trunk, extremities). Vibration is intact at the bilateral big toe. There is no extinction with double simultaneous stimulation. There is no sensory dermatomal level identified. Motor: Strength is at least 4/5 in the bilateral upper and lower extremities.  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 Deep tendon reflexes: Deep tendon reflexes are 2-/4 at the bilateral biceps, triceps, brachioradialis, patella and achilles. Plantar responses are downgoing bilaterally.  Movement examination: Tone: There is slight increased tone in the RUE.  There is normal tone but gegenhalten in the LUE.    The tone in the lower extremities is normal.  Abnormal movements: There is some RUE resting tremor Coordination:  There is minimal decremation with RAM's, seen most prominently with hand opening and closing on the left. Gait and Station: The patient has mild difficulty arising out of a deep-seated chair without the use of the hands. The patient's stride length is markedly decreased, with stooped posture and shuffling gait.  He turns en bloc.  Armswing is decreased.  ASSESSMENT/PLAN:  1.  Parkinson's disease with Parkinson's disease dementia  -I had a long discussion with the patient and his wife regarding the fact that this is not Lewy body dementia.  We discussed why that is.  -I really do not know why it seemed that his dementia progressed rapidly.  I do know from the records that he was complaining about memory loss since 2013, so perhaps it was not as rapid as it seems, although the hallucinations did start in December, 2015.  We are going to add Exelon patch, 4.6 mg for 1 month and then  increase to 9.5 mg daily.  Risks, benefits, side effects and alternative therapies were  discussed.  The opportunity to ask questions was given and they were answered to the best of my ability.  The patient expressed understanding and willingness to follow the outlined treatment protocols.  -His wife is thinking about discontinuing the Seroquel, which I certainly have no objection to.  She ended up deciding to wait for the 2 month titration of the Exelon and then she will likely discontinue the Seroquel.  She did not want to change too many factors at once, which is reasonable.  -We did discuss Nuplazid which was just FDA approved 2 days ago for Parkinson's psychosis.  It is not yet commercially available.  -Prescription written for transport chair  -Talked about the value of activities in the home that do not frustrate him.  They have tried playing cards and reading, which has been frustrating.  We talked about doing things like painting and coloring instead.  -Discussed extensively safety in the home.  Discussed "baby proofing" techniques, as he has been trying to put things in the wall sockets. 2.  Visual hallucinations.  -As above, we are adding Exelon patch and potentially discontinuing Seroquel soon.  They understand the black box warning.    -We decided to go ahead and do an MRI of the brain since his wife feels that memory decline has been rather precipitous. 3.  Dementia  -Discussed respite care.  His daughter is going to be helping 4 days per week.  -Continue care Roberts given on [employer], which is a company that sells a GPS tracking device for memory loss patient's. 4.  Much greater than 50% of this visit was spent in counseling with the patient and the family.  Total face to face time:  45 min

## 2014-08-07 ENCOUNTER — Ambulatory Visit
Admission: RE | Admit: 2014-08-07 | Discharge: 2014-08-07 | Disposition: A | Discharge status: Home / Self Care | Payer: Medicare Other | Source: Ambulatory Visit | Attending: Neurology | Admitting: Neurology

## 2014-08-07 DIAGNOSIS — R443 Hallucinations, unspecified: Secondary | ICD-10-CM | POA: Insufficient documentation

## 2014-08-07 DIAGNOSIS — G9340 Encephalopathy, unspecified: Secondary | ICD-10-CM | POA: Diagnosis not present

## 2014-08-08 ENCOUNTER — Telehealth: Payer: Self-pay | Admitting: Neurology

## 2014-08-08 NOTE — Telephone Encounter (Signed)
-----   Message from [place], DO sent at 08/08/2014  9:53 AM EDT ----- Motion artifact.  Small vessel disease, including in pons.  Luvenia Starch, you can let pt/wife know that nothing acute or unexpected on MRI brain

## 2014-08-08 NOTE — Telephone Encounter (Signed)
Patient's wife made aware of MR results.

## 2014-08-22 ENCOUNTER — Telehealth: Payer: Self-pay | Admitting: Neurology

## 2014-08-22 NOTE — Telephone Encounter (Signed)
Spoke with patient's wife. He is doing much better since yesterday. No other symptoms. BP okay. He is currently in physical therapy and did this today. They will let us know if they need anything or any symptoms return.

## 2014-08-22 NOTE — Telephone Encounter (Signed)
Received call yesterday from answering service (holiday, Monday).  Pts wife had called them with increasing weakness.  Orthostatic the day prior with BP of 68/51 per wife but 98/58 when EMS got there.  He had EKG and BS that was normal and he didn't feel bad so decided to not to go to hospital.  When they called me the following day, pt felt weak and had some trouble getting his pants on and some increased confusion.  BP was okay.  Advised to go to ED.  Luvenia Starch, will you check on them and see if okay.  Need PT?

## 2014-08-23 DEATH — deceased

## 2014-08-29 ENCOUNTER — Telehealth: Payer: Self-pay | Admitting: Neurology

## 2014-08-29 NOTE — Telephone Encounter (Signed)
Message relayed from Dr Tat to patient's wife.

## 2014-08-29 NOTE — Telephone Encounter (Signed)
Pt's wife [name] called in regards to her husband and had a question about his behavior last night/Dawn  CB# [phone]

## 2014-08-29 NOTE — Telephone Encounter (Signed)
Spoke with patient's wife and she states last night patient was very sluggish. She took his blood pressure and it was running 74/58. He watched some TV and went into the bedroom. She states she followed him into the bedroom where he yelled and cursed her to get out. He did not recognize her at all and threatened to call the police. She was able to leave the room and later he had calmed down and recognized her. He just started the higher dose exelon patch yesterday. He has been okay today. His blood pressure running 134/78. He doesn't remember any of this from yesterday. Her questions are: should patient increase seroquel and is low blood pressure effecting mental status? Please advise.

## 2014-08-29 NOTE — Telephone Encounter (Signed)
Would suspect that low blood pressure just a consequence of the disease but would go to PCP to make sure nothing like UTI going on either.  Would not increase seroquel right now.  Lets see how he does over next few days/weeks since exelon just increased

## 2014-08-29 NOTE — Telephone Encounter (Signed)
Left message on machine for patient to call back.

## 2014-09-04 ENCOUNTER — Ambulatory Visit: Payer: Self-pay | Admitting: Family Medicine

## 2014-09-05 ENCOUNTER — Ambulatory Visit: Payer: Medicare Other | Admitting: Neurology

## 2014-09-07 ENCOUNTER — Ambulatory Visit (INDEPENDENT_AMBULATORY_CARE_PROVIDER_SITE_OTHER): Payer: Medicare Other | Admitting: Podiatry

## 2014-09-07 VITALS — BP 118/72 | HR 65 | Resp 16 | Ht 70.0 in | Wt 165.0 lb

## 2014-09-07 DIAGNOSIS — M79676 Pain in unspecified toe(s): Secondary | ICD-10-CM

## 2014-09-07 DIAGNOSIS — L84 Corns and callosities: Secondary | ICD-10-CM | POA: Diagnosis not present

## 2014-09-07 DIAGNOSIS — B351 Tinea unguium: Secondary | ICD-10-CM | POA: Diagnosis not present

## 2014-09-07 NOTE — Progress Notes (Signed)
Subjective:     Patient ID: Shane Mooney., male   DOB: 1940-08-15, 74 y.o.   MRN: [id number]  HPI 74 year old male presents the office if his wife for concerns of thick, painful, elongated toenails which he is unable to trim himself. Patient's wife said she would file down her that have been thick and long she is unable to do them. He states the nails are painful to his shoe gear. Denies any redness or drainage on the nail sites. No other complaints at this time.  Review of Systems  All other systems reviewed and are negative.      Objective:   Physical Exam AAO x3, NAD DP/PT pulses palpable bilaterally, CRT less than 3 seconds Protective sensation intact with Simms Weinstein monofilament, vibratory sensation intact, Achilles tendon reflex intact Nails are hypertrophic, dystrophic, brittle, elongated, discolored x9. There is tenderness to palpation along the nails 1-5 on the left and 2-5 on the right. There is no surrounding erythema transabdominal nail sites. The right hallux toenail previous removed. There is a hyperkeratotic lesion on the right medial hallux IPJ. Upon debridement underlying skin is intact there is no ulceration, drainage or other clinical signs of infection. No other areas of tenderness to bilateral lower extremities.   No other open lesions or pre-ulcerative lesions.  No overlying edema, erythema, increase in warmth to bilateral lower extremities.  No pain with calf compression, swelling, warmth, erythema bilaterally.      Assessment:     74 year old male with symptomatic onychomycosis; right medial hallux IPJ hyperkeratotic lesion    Plan:     -Treatment options discussed including all alternatives, risks, and complications -Nail sharply debrided x9 without complications/bleeding  -Hyperkeratotic lesion sharply debrided x1 without complication/bleeding. -Discussed importance of daily foot inspection. -Follow-up 3 months or sooner if any problems arise. In  the meantime, encouraged to call the office with any questions, concerns, change in symptoms.

## 2014-09-07 NOTE — Progress Notes (Deleted)
Patient ID: Shane Mooney., male   DOB: [birth year], 74 y.o.   MRN: [id number] No right hallux nail Right medial halux ipj callus

## 2014-10-26 ENCOUNTER — Ambulatory Visit (INDEPENDENT_AMBULATORY_CARE_PROVIDER_SITE_OTHER): Payer: Medicare Other | Admitting: Neurology

## 2014-10-26 ENCOUNTER — Encounter: Payer: Self-pay | Admitting: Neurology

## 2014-10-26 VITALS — BP 122/80 | HR 68

## 2014-10-26 DIAGNOSIS — R443 Hallucinations, unspecified: Secondary | ICD-10-CM

## 2014-10-26 DIAGNOSIS — G20 Parkinson's disease: Secondary | ICD-10-CM

## 2014-10-26 DIAGNOSIS — F0280 Dementia in other diseases classified elsewhere without behavioral disturbance: Secondary | ICD-10-CM

## 2014-10-26 NOTE — Progress Notes (Signed)
Shane Mooney. was seen today in the movement disorders clinic for neurologic consultation at the request of Dr. Melrose Nakayama.  His PCP is Loistine Chance, MD.  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  He is accompanied by his wife who supplements the history.   I have notes from Dr. Melrose Nakayama, although they aren't from the onset of his relationship with Dr. Melrose Nakayama.  Pts wife indicates that he began to see Dr. Melrose Nakayama about 3 years ago.  He was shuffling and had tremor and those were the first sx's that got him to Dr. Melrose Nakayama.  He was dx with Parkinsons disease.  He did have memory change back then and was dx with dementia as well and placed on aricept as well as carbidopa/levodopa 25/100 and requip.  It seemed to initally help the walking but he began to have prominent hallucinations in December of 2015, at which point his azilect and requip were d/c but didn't make much of a difference in hallucinations.  He didn't realize that the visual hallucinations weren't real and christmas decorations seemed to make things worse ([place], the lights).  He is seeing little girls in the bedroom and hates to change in his bedroom because of this.  He told her that he didn't know his own bedroom and wanted to know how to get to bed.  Seroquel was started on 05/31/14, 25 mg, 1/2 po bid.  His wife hasn't noted any difference yet with this but the patient thinks that this may have helped.  He is having trouble focusing.  He doesn't keep active much, but he does read some and does play some solitaire.  His wife does think that when his meds were d/c, his shuffling got worse.    07/26/14 update:  The patient is following up today, accompanied by his wife who supplements the history.  Since last visit, I was able to get a copy of Dr. Lannie Fields records.  He began to see the patient in November, 2013.  At that time, the patient was experiencing tremor in the right leg and hand for about 5 years.  At the time that he  saw Dr. Melrose Nakayama in 2013 the patient was complaining about some memory changes by his MMSE was 29/30.  He was started on levodopa at that first visit which was 02/17/2012.  He followed up with Dr. Melrose Nakayama on 03/09/2012 and physically and mentally the patient felt better.  A note on April, 2014 reported that the patient felt lightheaded and the patient had discontinued levodopa and started Azilect.  In November, 2014 there were notes regarding mild cognitive impairment versus early dementia and the patient had started on Aricept.  In April, 2015 his MMSE was 30/30.  Since our last visit, the patient has increased his quetiapine to 25 mg twice a day (am and dinner).  He has restarted his carbidopa/levodopa 25/100, half a tablet 3 times per day (30 minutes before the meals).  His wife states that they have had a few incidents where he has not been able to recognize his wife; wanted proof that they were married.  His wife states that he no longer realizes that hallucinations weren't real and he will pick "mushrooms" off the floor.  Wife thinks he sees people and animals as well.  Not sleeping well at night.  Wife would like to consider discontinuing Seroquel as the hallucinations are really not bothering either of them.  Losing things because he is hiding  them and losing them.  Wife has a baby monitor.  Has door alarm and pt thinks that it keeps "two little girls out of the house."  He is losing weight; he eats candy.  He had diarrhea with ensure because he is lactose intolerant.  His wife asks about a GPS tracker for him.  He has had care self come into the home and that has been very beneficial.  His daughter is also going to be quitting her job and helping to caregiver about 4 days per week.  He had lab work done to include a normal CBC and a normal TSH of 1.172.  His chemistry was normal with the exception of an elevated total bilirubin of 3.1.  Review of old records indicates that patient had had symptoms since  2008, but had no memory complaints until at least 2013, but MMSE remained 30/30 even in 2015.  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  10/26/14 update:  The patient returns today, accompanied by his wife and a caregiver who supplements the history.  The patient has a history of Parkinson's disease with Parkinson's related dementia.  Last visit we added the Exelon patch and worked up from 4.6 mg to 9.5 mg. When the patch was increased, they noted a significant benefit and the "sundowners" is better.  He is more active during the day now and is shopping with his wife.   His wife had talked about discontinuing the Seroquel once he got up to the 9.5 mg, but ultimately decided not to do that.  He takes 1/2 in the AM and lunch and a full tablet at night.  He takes melatonin 4 mg at night.  He has had a few episodes of low blood pressure since our last visit.  Some of these episodes have been accompanied by increasing confusion but he is overall much better now.  He had an MRI of the brain done on 08/07/2014 that was nonacute and just demonstrated small vessel disease.  A note accompanies him today from his physical therapist, which indicates that the patient is doing much better with her.  No falls in quite some time but he does remember a few "stumbles" where he goes to his knees but that was a while ago.  He attributes that to vision issues.  His caregiver does state that seroquel seems to have caused some issues with loose stool.  Pt still has some issues intermittently with agitation but much better than in the past and using re-directing techniques that we talked about last visit has helped.  Neuroimaging has previously been performed.  It is not available for my review today.  PREVIOUS MEDICATIONS: Sinemet, Requip and azilect  ALLERGIES:  No Known Allergies  CURRENT MEDICATIONS:  Outpatient Encounter Prescriptions as of  10/26/2014  Medication Sig  . carbidopa-levodopa (SINEMET IR) 25-100 MG per tablet Take 0.5 tablets by mouth 3 (three) times daily.   . Cholecalciferol (VITAMIN D PO) Take 2,000 Units by mouth daily.  . Cyanocobalamin (VITAMIN B-12 PO) Take 1,000 mcg by mouth daily.  . Melatonin 3 MG CAPS Take by mouth at bedtime.  Marland Kitchen QUEtiapine (SEROQUEL) 25 MG tablet Take 1 tablet (25 mg total) by mouth 2 (two) times daily. (Patient taking differently: Take by mouth. 1/2 tablet in the morning, 1/2 tablet in the afternoon, 1 at night)  . rivastigmine (EXELON) 9.5 mg/24hr Place 1 patch (9.5 mg total) onto the skin  daily.  . [DISCONTINUED] Misc. Devices (QUAD CANE) MISC 1 Device by Does not apply route daily.  . [DISCONTINUED] Misc. Devices (TRANSPORT CHAIR) MISC 1 Device by Does not apply route daily.  . [DISCONTINUED] rivastigmine (EXELON) 4.6 mg/24hr Place 1 patch (4.6 mg total) onto the skin daily.   No facility-administered encounter medications on file as of 10/26/2014.    PAST MEDICAL HISTORY:   Past Medical History  Diagnosis Date  . Ulcer   . Hernia     [facility]  . 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 Personal history of colonic polyps   . Parkinson disease April 2014  . Dementia     PAST SURGICAL HISTORY:   Past Surgical History  Procedure Laterality Date  . Colonoscopy  2012    Dr Bary Castilla  . Hernia repair  2000    [facility]  . Repair of perforated ulcer  1994  . Cholecystectomy  2004  . Hemicolectomy Right 08/2011  . Skin cancer excision Left 2013    neck  . Polypectomy  2006     benign    SOCIAL HISTORY:   History   Social History  . Marital Status: Married    Spouse Name: N/A  . Number of Children: N/A  . Years of Education: N/A   Occupational History  . Not on file.   Social History Main Topics  . Smoking status: Former Smoker -- 0.50 packs/day for 10 years    Types: Cigarettes    Quit date: 06/05/1974  .  Smokeless tobacco: Never Used  . Alcohol Use: No  . Drug Use: No  . Sexual Activity: Not on file   Other Topics Concern  . Not on file   Social History Narrative    FAMILY HISTORY:   Family Status  Relation Status Death Age  . Mother Deceased     stroke  . Father Deceased     colon cancer  . Brother      adopted  . Son Alive     healthy  . Daughter Alive     healthy    ROS:  A complete 10 system review of systems was obtained and was unremarkable apart from what is mentioned above.  PHYSICAL EXAMINATION:    VITALS:   Filed Vitals:   10/26/14 0953  BP: 122/80  Pulse: 68   Wt Readings from Last 3 Encounters:  09/07/14 165 lb (74.844 kg)  07/26/14 164 lb (74.39 kg)  06/05/14 172 lb (78.019 kg)     GEN:  The patient appears stated age and is in NAD. HEENT:  Normocephalic, atraumatic.  The mucous membranes are moist. The superficial temporal arteries are without ropiness or tenderness. CV:  RRR Lungs:  CTAB Neck/HEME:  There are no carotid bruits bilaterally.  Neurological examination:  Orientation:  Montreal Cognitive Assessment  06/05/2014  Visuospatial/ Executive (0/5) 1  Naming (0/3) 3  Attention: Read list of digits (0/2) 2  Attention: Read list of letters (0/1) 0  Attention: Serial 7 subtraction starting at 100 (0/3) 1  Language: Repeat phrase (0/2) 1  Language : Fluency (0/1) 1  Abstraction (0/2) 1  Delayed Recall (0/5) 3  Orientation (0/6) 2  Total 15  Adjusted Score (based on education) 15    Cranial nerves: There is good facial symmetry.  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   The speech is fluent and clear.  He is hypophonic.  Soft palate rises symmetrically and there is no tongue deviation. Hearing is decreased to conversational tone. Sensation: Sensation is intact to light throughout. Motor: Strength is at least 4/5 in the bilateral upper and lower extremities.  He has difficulty participating in manual motor testing at times because of  apraxia.   Movement examination: Tone: There is slight increased tone in the RUE.  There is normal tone but gegenhalten in the LUE.    The tone in the lower extremities is normal.  Abnormal movements: There is some RUE resting tremor Coordination:  There is minimal decremation with RAM's, seen most prominently with hand opening and closing on the left. Gait and Station: The patient arises out of the transport chair by pushing off of the arms.  He has start hesitation.  Once he is out in the open, he is able to walk more fluidly.  He does shuffle somewhat.  He turns en bloc.  ASSESSMENT/PLAN:  1.  Parkinson's disease with Parkinson's disease dementia  -I had a long discussion with the patient and his wife regarding the fact that this is not Lewy body dementia.  We discussed why that is.  -The patient seems to be doing better now that he is on the Exelon patch, 9.5 mg daily.  He is having no site reactions.  -He will continue with the Seroquel, 25 mg, one half tablet in the morning, half tablet in the afternoon and one tablet in the evening.  We did talk about the fact that the atypical antipsychotic medications are not indicated for dementia related psychosis and increased risk of mortality in the elderly, usually because of infectious or  cardiac related etiologies. Understanding is expressed and they were agreeable that the benefits outweigh the risks in this case.  -Talked about the value of mental and physical activities.  He is doing better than he was.  We talked about redirecting him when he has perseveration. 2.  Visual hallucinations.  -Much improved on a combination of Exelon patch and quetiapine.  They understand the black box warning.   3.  Follow-up in the next few months, sooner should new neurologic issues arise.  Much greater than 50% of this visit was spent in counseling with the patient and the family.  Total face to face time:  25 min

## 2014-12-12 ENCOUNTER — Ambulatory Visit (INDEPENDENT_AMBULATORY_CARE_PROVIDER_SITE_OTHER): Payer: Medicare Other | Admitting: Podiatry

## 2014-12-12 ENCOUNTER — Ambulatory Visit: Payer: Medicare Other | Admitting: Podiatry

## 2014-12-12 DIAGNOSIS — B351 Tinea unguium: Secondary | ICD-10-CM

## 2014-12-12 DIAGNOSIS — M79676 Pain in unspecified toe(s): Secondary | ICD-10-CM | POA: Diagnosis not present

## 2014-12-12 DIAGNOSIS — L84 Corns and callosities: Secondary | ICD-10-CM

## 2014-12-12 DIAGNOSIS — B353 Tinea pedis: Secondary | ICD-10-CM

## 2014-12-12 NOTE — Progress Notes (Signed)
Subjective:     Patient ID: Shane Mooney., male   DOB: June 26, 1940, 74 y.o.   MRN: [id number]  HPI 74 year old male presents the office if his wife for concerns of thick, painful, elongated toenails which he is unable to trim himself.  Denies any redness or drainage on the nail sites. No other complaints at this time. No systemic complaints such as fevers, chills, nausea, vomiting.      Objective:   Physical Exam AAO x3, NAD DP/PT pulses palpable bilaterally, CRT less than 3 seconds Protective sensation intact with Simms Weinstein monofilament Nails are hypertrophic, dystrophic, brittle, elongated, discolored x9. There is tenderness to palpation along the nails 1-5 on the left and 2-5 on the right. There is no surrounding erythema or drainage along the nail sites. The right hallux toenail previous removed. There is a hyperkeratotic lesion on the right medial hallux IPJ. Upon debridement underlying skin is intact there is no ulceration, drainage or other clinical signs of infection. There is mild areas of peeling, dry, erythematous skin interdigitally on the right foot consistent with tinea pedis.  No other areas of tenderness to bilateral lower extremities.   No other open lesions or pre-ulcerative lesions.  No overlying edema, erythema, increase in warmth to bilateral lower extremities.  No pain with calf compression, swelling, warmth, erythema bilaterally.      Assessment:     74 year old male with symptomatic onychomycosis; right medial hallux IPJ hyperkeratotic lesion; tinea pedis    Plan:     -Treatment options discussed including all alternatives, risks, and complications -Nail sharply debrided x9 without complications/bleeding  -Hyperkeratotic lesion sharply debrided x1 without complication/bleeding, much improved from last time. -Recommended OTC treatment for tinea infection. If not clearing, or worsens will try prescription.  -Discussed importance of daily foot  inspection. -Follow-up 3 months or sooner if any problems arise. In the meantime, encouraged to call the office with any questions, concerns, change in symptoms.    Celesta Gentile, DPM

## 2014-12-21 ENCOUNTER — Other Ambulatory Visit: Payer: Self-pay | Admitting: Neurology

## 2014-12-21 MED ORDER — RIVASTIGMINE 9.5 MG/24HR TD PT24: 9.5000 mg | MEDICATED_PATCH | Freq: Every day | TRANSDERMAL | Status: DC

## 2014-12-21 NOTE — Telephone Encounter (Signed)
Exelon patch refill requested. Per last office note- patient to remain on medication. Refill approved and sent to patient's pharmacy.

## 2014-12-27 ENCOUNTER — Other Ambulatory Visit: Payer: Self-pay | Admitting: Neurology

## 2014-12-27 MED ORDER — QUETIAPINE FUMARATE 25 MG PO TABS: ORAL_TABLET | ORAL | Status: DC

## 2014-12-27 NOTE — Telephone Encounter (Signed)
Seroquel refill requested. Per last office note- patient to remain on medication. Refill approved and sent to patient's pharmacy.   

## 2015-01-25 ENCOUNTER — Ambulatory Visit (INDEPENDENT_AMBULATORY_CARE_PROVIDER_SITE_OTHER): Payer: Medicare Other | Admitting: Neurology

## 2015-01-25 ENCOUNTER — Encounter: Payer: Self-pay | Admitting: Neurology

## 2015-01-25 VITALS — BP 132/70 | HR 72

## 2015-01-25 DIAGNOSIS — R443 Hallucinations, unspecified: Secondary | ICD-10-CM | POA: Diagnosis not present

## 2015-01-25 DIAGNOSIS — F0281 Dementia in other diseases classified elsewhere with behavioral disturbance: Secondary | ICD-10-CM | POA: Diagnosis not present

## 2015-01-25 DIAGNOSIS — G20 Parkinson's disease: Secondary | ICD-10-CM | POA: Diagnosis not present

## 2015-01-25 MED ORDER — CARBIDOPA-LEVODOPA 25-100 MG PO TABS: 0.5000 | ORAL_TABLET | Freq: Three times a day (TID) | ORAL | Status: AC

## 2015-01-25 MED ORDER — RIVASTIGMINE 9.5 MG/24HR TD PT24: 9.5000 mg | MEDICATED_PATCH | Freq: Every day | TRANSDERMAL | Status: AC

## 2015-01-25 MED ORDER — QUETIAPINE FUMARATE 25 MG PO TABS: ORAL_TABLET | ORAL | Status: AC

## 2015-01-25 NOTE — Progress Notes (Signed)
Shane Mooney. was seen today in the movement disorders clinic for neurologic consultation at the request of Dr. Melrose Nakayama.  His PCP is Loistine Chance, MD.  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  He is accompanied by his wife who supplements the history.   I have notes from Dr. Melrose Nakayama, although they aren't from the onset of his relationship with Dr. Melrose Nakayama.  Pts wife indicates that he began to see Dr. Melrose Nakayama about 3 years ago.  He was shuffling and had tremor and those were the first sx's that got him to Dr. Melrose Nakayama.  He was dx with Parkinsons disease.  He did have memory change back then and was dx with dementia as well and placed on aricept as well as carbidopa/levodopa 25/100 and requip.  It seemed to initally help the walking but he began to have prominent hallucinations in December of 2015, at which point his azilect and requip were d/c but didn't make much of a difference in hallucinations.  He didn't realize that the visual hallucinations weren't real and christmas decorations seemed to make things worse ([place], the lights).  He is seeing little girls in the bedroom and hates to change in his bedroom because of this.  He told her that he didn't know his own bedroom and wanted to know how to get to bed.  Seroquel was started on 05/31/14, 25 mg, 1/2 po bid.  His wife hasn't noted any difference yet with this but the patient thinks that this may have helped.  He is having trouble focusing.  He doesn't keep active much, but he does read some and does play some solitaire.  His wife does think that when his meds were d/c, his shuffling got worse.    07/26/14 update:  The patient is following up today, accompanied by his wife who supplements the history.  Since last visit, I was able to get a copy of Dr. Lannie Fields records.  He began to see the patient in November, 2013.  At that time, the patient was experiencing tremor in the right leg and hand for about 5 years.  At the time that he  saw Dr. Melrose Nakayama in 2013 the patient was complaining about some memory changes by his MMSE was 29/30.  He was started on levodopa at that first visit which was 02/17/2012.  He followed up with Dr. Melrose Nakayama on 03/09/2012 and physically and mentally the patient felt better.  A note on April, 2014 reported that the patient felt lightheaded and the patient had discontinued levodopa and started Azilect.  In November, 2014 there were notes regarding mild cognitive impairment versus early dementia and the patient had started on Aricept.  In April, 2015 his MMSE was 30/30.  Since our last visit, the patient has increased his quetiapine to 25 mg twice a day (am and dinner).  He has restarted his carbidopa/levodopa 25/100, half a tablet 3 times per day (30 minutes before the meals).  His wife states that they have had a few incidents where he has not been able to recognize his wife; wanted proof that they were married.  His wife states that he no longer realizes that hallucinations weren't real and he will pick "mushrooms" off the floor.  Wife thinks he sees people and animals as well.  Not sleeping well at night.  Wife would like to consider discontinuing Seroquel as the hallucinations are really not bothering either of them.  Losing things because he is hiding  them and losing them.  Wife has a baby monitor.  Has door alarm and pt thinks that it keeps "two little girls out of the house."  He is losing weight; he eats candy.  He had diarrhea with ensure because he is lactose intolerant.  His wife asks about a GPS tracker for him.  He has had care self come into the home and that has been very beneficial.  His daughter is also going to be quitting her job and helping to caregiver about 4 days per week.  He had lab work done to include a normal CBC and a normal TSH of 1.172.  His chemistry was normal with the exception of an elevated total bilirubin of 3.1.  Review of old records indicates that patient had had symptoms since  2008, but had no memory complaints until at least 2013, but MMSE remained 30/30 even in 2015.  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  10/26/14 update:  The patient returns today, accompanied by his wife and a caregiver who supplements the history.  The patient has a history of Parkinson's disease with Parkinson's related dementia.  Last visit we added the Exelon patch and worked up from 4.6 mg to 9.5 mg. When the patch was increased, they noted a significant benefit and the "sundowners" is better.  He is more active during the day now and is shopping with his wife.   His wife had talked about discontinuing the Seroquel once he got up to the 9.5 mg, but ultimately decided not to do that.  He takes 1/2 in the AM and lunch and a full tablet at night.  He takes melatonin 4 mg at night.  He has had a few episodes of low blood pressure since our last visit.  Some of these episodes have been accompanied by increasing confusion but he is overall much better now.  He had an MRI of the brain done on 08/07/2014 that was nonacute and just demonstrated small vessel disease.  A note accompanies him today from his physical therapist, which indicates that the patient is doing much better with her.  No falls in quite some time but he does remember a few "stumbles" where he goes to his knees but that was a while ago.  He attributes that to vision issues.  His caregiver does state that seroquel seems to have caused some issues with loose stool.  Pt still has some issues intermittently with agitation but much better than in the past and using re-directing techniques that we talked about last visit has helped.  01/25/15 update:  The patient is following up today, accompanied by his wife who supplements the history.  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  He remains on carbidopa/levodopa 25/100, half a tablet  3 times per day.  He remains on the Exelon patch, 9.5 mg daily as well as quetiapine, which has definitely helps agitation and visual hallucinations.  He is on 25 mg, half in the morning, half tablet in the afternoon and one tablet at night.  He is still having multiple hallucinations of people and animals but state that they don't scare him and his wife states that he will want to clear the house of the "animals" before he goes to bed.  They seem to be increasing.  He is having more agitation as well.  In addition, his wife states that they are having  a lot of issues with his bowels.  He sometimes does not sit on the toilet properly and it goes all over the floor and then he may throw his soiled toilet paper all over the ground.  He has had 2 falls.  With one his knees just "gave out on him."  He didn't have his walker but he seems to generally be using that.  However, his wife states that he often will ask for the cane and will get very mad at her if she suggests he uses a walker.  They had PT 2 weeks ago but "put it on hold" until he came back here.  His wife is helping to caregiver as is his daughter.  However, the burden is growing and they are considering an adult daycare center.  Neuroimaging has previously been performed.  It is not available for my review today.  PREVIOUS MEDICATIONS: Sinemet, Requip and azilect  ALLERGIES:  No Known Allergies  CURRENT MEDICATIONS:  Outpatient Encounter Prescriptions as of 01/25/2015  Medication Sig  . carbidopa-levodopa (SINEMET IR) 25-100 MG per tablet Take 0.5 tablets by mouth 3 (three) times daily.   . Cholecalciferol (VITAMIN D PO) Take 2,000 Units by mouth daily.  . Cyanocobalamin (VITAMIN B-12 PO) Take 1,000 mcg by mouth daily.  . Melatonin 3 MG CAPS Take by mouth at bedtime.  Marland Kitchen QUEtiapine (SEROQUEL) 25 MG tablet 1/2 tablet in the morning, 1/2 tablet in the afternoon, 1 at night  . rivastigmine (EXELON) 9.5 mg/24hr Place 1 patch (9.5 mg total) onto the  skin daily.   No facility-administered encounter medications on file as of 01/25/2015.    PAST MEDICAL HISTORY:   Past Medical History  Diagnosis Date  . Ulcer   . Hernia     [facility]  . 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 Personal history of colonic polyps   . Parkinson disease April 2014  . Dementia     PAST SURGICAL HISTORY:   Past Surgical History  Procedure Laterality Date  . Colonoscopy  2012    Dr Bary Castilla  . Hernia repair  2000    [facility]  . Repair of perforated ulcer  1994  . Cholecystectomy  2004  . Hemicolectomy Right 08/2011  . Skin cancer excision Left 2013    neck  . Polypectomy  2006     benign    SOCIAL HISTORY:   Social History   Social History  . Marital Status: Married    Spouse Name: N/A  . Number of Children: N/A  . Years of Education: N/A   Occupational History  . Not on file.   Social History Main Topics  . Smoking status: Former Smoker -- 0.50 packs/day for 10 years    Types: Cigarettes    Quit date: 06/05/1974  . Smokeless tobacco: Never Used  . Alcohol Use: No  . Drug Use: No  . Sexual Activity: Not on file   Other Topics Concern  . Not on file   Social History Narrative    FAMILY HISTORY:   Family Status  Relation Status Death Age  . Mother Deceased     stroke  . Father Deceased     colon cancer  . Brother      adopted  . Son Alive     healthy  . Daughter Alive     healthy    ROS:  A complete 10 system  review of systems was obtained and was unremarkable apart from what is mentioned above.  PHYSICAL EXAMINATION:    VITALS:   Filed Vitals:   01/25/15 1445  BP: 132/70  Pulse: 72   Wt Readings from Last 3 Encounters:  09/07/14 165 lb (74.844 kg)  07/26/14 164 lb (74.39 kg)  06/05/14 172 lb (78.019 kg)     GEN:  The patient appears stated age and is in NAD. HEENT:  Normocephalic, atraumatic.  The mucous membranes are moist. The superficial  temporal arteries are without ropiness or tenderness. CV:  RRR Lungs:  CTAB Neck/HEME:  There are no carotid bruits bilaterally.  Neurological examination:  Orientation:  Montreal Cognitive Assessment  06/05/2014  Visuospatial/ Executive (0/5) 1  Naming (0/3) 3  Attention: Read list of digits (0/2) 2  Attention: Read list of letters (0/1) 0  Attention: Serial 7 subtraction starting at 100 (0/3) 1  Language: Repeat phrase (0/2) 1  Language : Fluency (0/1) 1  Abstraction (0/2) 1  Delayed Recall (0/5) 3  Orientation (0/6) 2  Total 15  Adjusted Score (based on education) 15    Cranial nerves: There is good facial symmetry.  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   The speech is fluent and clear.  He is hypophonic.  Soft palate rises symmetrically and there is no tongue deviation. Hearing is decreased to conversational tone. Sensation: Sensation is intact to light throughout. Motor: Strength is at least 4/5 in the bilateral upper and lower extremities.  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   Movement examination: Tone: There is normal tone in the upper and lower extremities today Abnormal movements: There is no significant tremor noted today Coordination:  There is no decremation with rapid alternating movements noted. Gait and Station: Not tested today.  Patient is in the wheelchair  ASSESSMENT/PLAN:  1.  Parkinson's disease with Parkinson's disease dementia  -I had a long discussion with the patient and his wife regarding end-of-life/long-term care issues.  The caregiving burden is growing heavily for his wife.  We talked about the adult daycare center, but I told her that most of them will not accept patients with bowel issues, as he is having.  She has already talked to the daycare center at [facility] and they're willing to take him as long as the bowel issues are not often.  I told her that I think that this is a good short-term plan, but I  also think that they need a long-term plan as I am not sure that he will be able to be cared for in home much longer.  We talked about community resources.  -The patient seems to be doing better now that he is on the Exelon patch, 9.5 mg daily.  He is having no site reactions.  -He will increase the the Seroquel, 25 mg, one fall tablet in the morning, half tablet in the afternoon and one tablet in the evening.  I told his wife she could keep an extra half as needed.  We did again talk about the fact that the atypical antipsychotic medications are not indicated for dementia related psychosis and increased risk of mortality in the elderly, usually because of infectious or  cardiac related etiologies.  We did talk about Nuplazid but ultimately decided that leaving him on the quetiapine was probably the best course of action.  Understanding is expressed and they were agreeable that the benefits outweigh the risks in this case.  -Talked  about the value of mental and physical activities.  Talked about the importance of using the walker at all times and no longer even attempting to use the cane.  -[facility].  They had issues with [facility] with the caregiver not coming on time. 2.  Visual hallucinations.  -Was significantly improved on a combination of Exelon patch and quetiapine but they appear to becoming back more as the disease is progressing.  As above, we will increase his quetiapine.  They understand the black box warning.   3.  Follow-up in the next few months, sooner should new neurologic issues arise.  Much greater than 50% of this visit was spent in counseling with the patient and the family.  Total face to face time:  40 min

## 2015-01-25 NOTE — Patient Instructions (Addendum)
Increase Seroquel to 1 tablet in the morning, 1/2 tablet in the afternoon and 1 tablet in the evening. You can take 1/2 tablet extra per day as needed.  We will [facility] and they will contact you directly to set up therapy.

## 2015-02-07 ENCOUNTER — Telehealth: Payer: Self-pay | Admitting: Neurology

## 2015-02-07 DIAGNOSIS — G20 Parkinson's disease: Secondary | ICD-10-CM

## 2015-02-07 NOTE — Telephone Encounter (Signed)
Pt/wife Shane Mooney/ called to inform that she hasn't heard from the Phy therapist /call back @ [phone]

## 2015-02-07 NOTE — Telephone Encounter (Signed)
Patient's wife made aware that order sent a message sent to [facility] to reach out to patient to start PT.

## 2015-02-12 ENCOUNTER — Telehealth: Payer: Self-pay | Admitting: Neurology

## 2015-02-12 NOTE — Telephone Encounter (Signed)
Nurse asking for verbal order for speech therapy evaluation. Wife states speech is getting worse. Verbal order given. They will call with any questions.

## 2015-02-12 NOTE — Telephone Encounter (Signed)
Shane Mooney/[facility]/called for a verbal order for Speech therapy/call back @ [phone]

## 2015-02-26 ENCOUNTER — Telehealth: Payer: Self-pay | Admitting: Neurology

## 2015-02-26 ENCOUNTER — Ambulatory Visit: Payer: Medicare Other | Admitting: Neurology

## 2015-02-26 NOTE — Telephone Encounter (Signed)
Go to ER or UC

## 2015-02-26 NOTE — Telephone Encounter (Signed)
Amy, speech therapist with [facility], called [phone]) came to see patient this morning and states he is acting very out of character. He has been very confused this weekend and fell a couple times with no injuries (did not hit head, did not lose consciousness). He wouldn't eat last night and he has been very restless. She took his blood pressure manually and it is reading 90/48. They had an automatic cuff that read 50/34 and he keeps falling asleep. He had no complaints when awake, states he did not feel light headed, did not seem in distress. Please advise. Patient call back number [phone].

## 2015-02-26 NOTE — Telephone Encounter (Signed)
Patients family made aware

## 2015-03-06 ENCOUNTER — Encounter: Payer: Self-pay | Admitting: Emergency Medicine

## 2015-03-06 ENCOUNTER — Telehealth: Payer: Self-pay

## 2015-03-06 ENCOUNTER — Emergency Department: Payer: Medicare Other

## 2015-03-06 ENCOUNTER — Observation Stay: Admit: 2015-03-06 | Payer: Medicare Other

## 2015-03-06 ENCOUNTER — Observation Stay
Admission: EM | Admit: 2015-03-06 | Discharge: 2015-03-07 | Disposition: A | Discharge status: Home / Self Care | Payer: Medicare Other | Attending: Internal Medicine | Admitting: Internal Medicine

## 2015-03-06 ENCOUNTER — Telehealth: Payer: Self-pay | Admitting: Neurology

## 2015-03-06 DIAGNOSIS — R29810 Facial weakness: Secondary | ICD-10-CM | POA: Insufficient documentation

## 2015-03-06 DIAGNOSIS — Z85828 Personal history of other malignant neoplasm of skin: Secondary | ICD-10-CM | POA: Diagnosis not present

## 2015-03-06 DIAGNOSIS — I5030 Unspecified diastolic (congestive) heart failure: Secondary | ICD-10-CM | POA: Diagnosis not present

## 2015-03-06 DIAGNOSIS — I071 Rheumatic tricuspid insufficiency: Secondary | ICD-10-CM | POA: Insufficient documentation

## 2015-03-06 DIAGNOSIS — I951 Orthostatic hypotension: Principal | ICD-10-CM | POA: Insufficient documentation

## 2015-03-06 DIAGNOSIS — Z86010 Personal history of colonic polyps: Secondary | ICD-10-CM | POA: Diagnosis not present

## 2015-03-06 DIAGNOSIS — G459 Transient cerebral ischemic attack, unspecified: Secondary | ICD-10-CM | POA: Insufficient documentation

## 2015-03-06 DIAGNOSIS — I5189 Other ill-defined heart diseases: Secondary | ICD-10-CM

## 2015-03-06 DIAGNOSIS — I639 Cerebral infarction, unspecified: Secondary | ICD-10-CM | POA: Diagnosis present

## 2015-03-06 DIAGNOSIS — Z7982 Long term (current) use of aspirin: Secondary | ICD-10-CM | POA: Insufficient documentation

## 2015-03-06 DIAGNOSIS — K2920 Alcoholic gastritis without bleeding: Secondary | ICD-10-CM | POA: Diagnosis not present

## 2015-03-06 DIAGNOSIS — G3183 Dementia with Lewy bodies: Secondary | ICD-10-CM | POA: Diagnosis not present

## 2015-03-06 DIAGNOSIS — R410 Disorientation, unspecified: Secondary | ICD-10-CM | POA: Insufficient documentation

## 2015-03-06 DIAGNOSIS — Z9049 Acquired absence of other specified parts of digestive tract: Secondary | ICD-10-CM | POA: Insufficient documentation

## 2015-03-06 DIAGNOSIS — E041 Nontoxic single thyroid nodule: Secondary | ICD-10-CM | POA: Diagnosis not present

## 2015-03-06 DIAGNOSIS — Z87891 Personal history of nicotine dependence: Secondary | ICD-10-CM | POA: Diagnosis not present

## 2015-03-06 DIAGNOSIS — I6529 Occlusion and stenosis of unspecified carotid artery: Secondary | ICD-10-CM | POA: Diagnosis present

## 2015-03-06 DIAGNOSIS — G20 Parkinson's disease: Secondary | ICD-10-CM | POA: Insufficient documentation

## 2015-03-06 DIAGNOSIS — Z79899 Other long term (current) drug therapy: Secondary | ICD-10-CM | POA: Insufficient documentation

## 2015-03-06 DIAGNOSIS — R42 Dizziness and giddiness: Secondary | ICD-10-CM | POA: Insufficient documentation

## 2015-03-06 LAB — URINALYSIS COMPLETE WITH MICROSCOPIC (ARMC ONLY)
BACTERIA UA: NONE SEEN
Bilirubin Urine: NEGATIVE
GLUCOSE, UA: NEGATIVE mg/dL
Hgb urine dipstick: NEGATIVE
Leukocytes, UA: NEGATIVE
NITRITE: NEGATIVE
PROTEIN: 30 mg/dL — AB
Specific Gravity, Urine: 1.016 (ref 1.005–1.030)
Squamous Epithelial / LPF: NONE SEEN
pH: 5 (ref 5.0–8.0)

## 2015-03-06 LAB — BASIC METABOLIC PANEL
Anion gap: 10 (ref 5–15)
BUN: 19 mg/dL (ref 6–20)
CALCIUM: 9.3 mg/dL (ref 8.9–10.3)
CHLORIDE: 101 mmol/L (ref 101–111)
CO2: 30 mmol/L (ref 22–32)
CREATININE: 1 mg/dL (ref 0.61–1.24)
GFR calc Af Amer: >60 mL/min (ref >60)
Glucose, Bld: 88 mg/dL (ref 65–99)
Potassium: 3.9 mmol/L (ref 3.5–5.1)
SODIUM: 141 mmol/L (ref 135–145)

## 2015-03-06 LAB — CBC
HCT: 39.1 % — ABNORMAL LOW (ref 40.0–52.0)
Hemoglobin: 13.4 g/dL (ref 13.0–18.0)
MCH: 31.2 pg (ref 26.0–34.0)
MCHC: 34.2 g/dL (ref 32.0–36.0)
MCV: 91.3 fL (ref 80.0–100.0)
PLATELETS: 240 10*3/uL (ref 150–440)
RBC: 4.29 MIL/uL — ABNORMAL LOW (ref 4.40–5.90)
RDW: 13.9 % (ref 11.5–14.5)
WBC: 9.6 10*3/uL (ref 3.8–10.6)

## 2015-03-06 LAB — TROPONIN I

## 2015-03-06 MED ORDER — IOHEXOL 350 MG/ML SOLN
100.0000 mL | Freq: Once | INTRAVENOUS | Status: AC | PRN
  Administered 2015-03-06: 100 mL via INTRAVENOUS

## 2015-03-06 MED ORDER — ASPIRIN EC 81 MG PO TBEC
81.0000 mg | DELAYED_RELEASE_TABLET | Freq: Every day | ORAL | Status: DC
  Administered 2015-03-07: 81 mg via ORAL
  Filled 2015-03-06: qty 1

## 2015-03-06 MED ORDER — DOCUSATE SODIUM 100 MG PO CAPS
100.0000 mg | ORAL_CAPSULE | Freq: Two times a day (BID) | ORAL | Status: DC
  Administered 2015-03-07: 100 mg via ORAL
  Filled 2015-03-06 (×2): qty 1

## 2015-03-06 MED ORDER — RIVASTIGMINE 9.5 MG/24HR TD PT24
9.5000 mg | MEDICATED_PATCH | Freq: Every day | TRANSDERMAL | Status: DC
  Administered 2015-03-06 – 2015-03-07 (×2): 9.5 mg via TRANSDERMAL
  Filled 2015-03-06 (×3): qty 1

## 2015-03-06 MED ORDER — ONDANSETRON HCL 4 MG PO TABS: 4.0000 mg | ORAL_TABLET | Freq: Four times a day (QID) | ORAL | Status: DC | PRN

## 2015-03-06 MED ORDER — ACETAMINOPHEN 650 MG RE SUPP: 650.0000 mg | Freq: Four times a day (QID) | RECTAL | Status: DC | PRN

## 2015-03-06 MED ORDER — HYDRALAZINE HCL 20 MG/ML IJ SOLN: 10.0000 mg | Freq: Four times a day (QID) | INTRAMUSCULAR | Status: DC | PRN

## 2015-03-06 MED ORDER — QUETIAPINE FUMARATE 25 MG PO TABS
25.0000 mg | ORAL_TABLET | Freq: Two times a day (BID) | ORAL | Status: DC
Start: 2015-03-06 — End: 2015-03-07
  Administered 2015-03-06 – 2015-03-07 (×2): 25 mg via ORAL
  Filled 2015-03-06 (×2): qty 1

## 2015-03-06 MED ORDER — SODIUM CHLORIDE 0.9 % IV SOLN
INTRAVENOUS | Status: AC
  Administered 2015-03-06: 20:00:00 via INTRAVENOUS

## 2015-03-06 MED ORDER — ONDANSETRON HCL 4 MG/2ML IJ SOLN: 4.0000 mg | Freq: Four times a day (QID) | INTRAMUSCULAR | Status: DC | PRN

## 2015-03-06 MED ORDER — ASPIRIN 81 MG PO CHEW
324.0000 mg | CHEWABLE_TABLET | Freq: Once | ORAL | Status: AC
  Administered 2015-03-06: 324 mg via ORAL
  Filled 2015-03-06: qty 4

## 2015-03-06 MED ORDER — ALBUTEROL SULFATE (2.5 MG/3ML) 0.083% IN NEBU: 2.5000 mg | INHALATION_SOLUTION | RESPIRATORY_TRACT | Status: DC | PRN

## 2015-03-06 MED ORDER — ACETAMINOPHEN 325 MG PO TABS: 650.0000 mg | ORAL_TABLET | Freq: Four times a day (QID) | ORAL | Status: DC | PRN

## 2015-03-06 MED ORDER — MELATONIN 3 MG PO CAPS: ORAL_CAPSULE | Freq: Every day | ORAL | Status: DC

## 2015-03-06 MED ORDER — CARBIDOPA-LEVODOPA 25-100 MG PO TABS
0.5000 | ORAL_TABLET | Freq: Three times a day (TID) | ORAL | Status: DC
  Administered 2015-03-06: 22:00:00 0.5 via ORAL
  Administered 2015-03-07: 12:00:00 via ORAL
  Filled 2015-03-06 (×2): qty 1

## 2015-03-06 MED ORDER — ENOXAPARIN SODIUM 40 MG/0.4ML ~~LOC~~ SOLN
40.0000 mg | SUBCUTANEOUS | Status: DC
  Administered 2015-03-06: 40 mg via SUBCUTANEOUS
  Filled 2015-03-06: qty 0.4

## 2015-03-06 MED ORDER — SODIUM CHLORIDE 0.9 % IV BOLUS (SEPSIS)
500.0000 mL | Freq: Once | INTRAVENOUS | Status: AC
Start: 2015-03-06 — End: 2015-03-06
  Administered 2015-03-06: 500 mL via INTRAVENOUS

## 2015-03-06 NOTE — H&P (Signed)
[facility] NAME: Shane Mooney    MR#:  [id number]  DATE OF BIRTH:  Aug 08, 1940  DATE OF ADMISSION:  03/06/2015  PRIMARY CARE PHYSICIAN: Loistine Chance, MD   REQUESTING/REFERRING PHYSICIAN: Dr. Edd Fabian  CHIEF COMPLAINT:   Chief Complaint  Patient presents with  . Hypotension    HISTORY OF PRESENT ILLNESS:  Shane Mooney  is a 74 y.o. male with a known history of Parkinson's, dementia presented to the emergency room by EMS after he was found to have hypotension into the 60s by home health physical therapy. Patient has dementia and is unable to contribute to history. History has been obtained from wife at bedside, old records and ER staff. Wife mentions that patient felt dizzy and lightheaded yesterday and his legs were buckling. Today when physical therapy arrived at home for his Parkinson's disease to found that his blood pressure was 60 x 40 and his PCP was called and was sent to the emergency room. Here patient's blood pressure improved with IV fluids. He seems to have developed hypotension since he was diagnosed with Parkinson's disease and started on medications. There is no history of hypertension. Initial CT scan of the head raised some concern for an acute thrombus in the left MCA. I discussed case with Dr. Irish Elders of neurology and ordered a stat CTA of the brain which showed no thrombus or significant stenosis. Patient was noticed to have left facial droop initially which has resolved at this time. He was also drowsy with his hypertension chest significantly improved and patient is back to baseline as per family at bedside.  PAST MEDICAL HISTORY:   Past Medical History  Diagnosis Date  . Ulcer   . Hernia     LIH  . 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 Cancer ([place]) 2013    basal cell carcinoma back and face  . Personal history of colonic polyps   . Parkinson disease [facility]) April 2014  . Dementia      PAST SURGICAL HISTORY:   Past Surgical History  Procedure Laterality Date  . Colonoscopy  2012    Dr Bary Castilla  . Hernia repair  2000    LIH  . Repair of perforated ulcer  1994  . Cholecystectomy  2004  . Hemicolectomy Right 08/2011  . Skin cancer excision Left 2013    neck  . Polypectomy  2006     benign    SOCIAL HISTORY:   Social History  Substance Use Topics  . Smoking status: Former Smoker -- 0.50 packs/day for 10 years    Types: Cigarettes    Quit date: 06/05/1974  . Smokeless tobacco: Never Used  . Alcohol Use: No    FAMILY HISTORY:   HTN DRUG ALLERGIES:  No Known Allergies  REVIEW OF SYSTEMS:   Review of Systems  Unable to perform ROS: dementia    MEDICATIONS AT HOME:   Prior to Admission medications   Medication Sig Start Date End Date Taking? Authorizing Provider  carbidopa-levodopa (SINEMET IR) 25-100 MG tablet Take 0.5 tablets by mouth 3 (three) times daily. 01/25/15  Yes Rebecca S Tat, DO  Cholecalciferol (VITAMIN D PO) Take 2,000 Units by mouth daily.   Yes Historical Provider, MD  Cyanocobalamin (VITAMIN B-12 PO) Take 1,000 mcg by mouth daily.   Yes Historical Provider, MD  Melatonin 3 MG CAPS Take by mouth at bedtime.   Yes Historical Provider, MD  QUEtiapine (SEROQUEL) 25  MG tablet 1 tablet in the morning, 1/2 tablet in the afternoon, 1 at night. Patient can take an extra 1/2 tablet daily as needed 01/25/15  Yes Rebecca S Tat, DO  rivastigmine (EXELON) 9.5 mg/24hr Place 1 patch (9.5 mg total) onto the skin daily. 01/25/15  Yes Rebecca S Tat, DO      VITAL SIGNS:  Blood pressure 176/83, pulse 72, temperature 97.8 F (36.6 C), temperature source Oral, resp. rate 15, height 5\' 10"  (1.778 m), weight 74.844 kg (165 lb), SpO2 100 %.  PHYSICAL EXAMINATION:  Physical Exam  GENERAL:  74 y.o.-year-old patient lying in the bed with no acute distress. Confused EYES: Pupils equal, round, reactive to light and accommodation. No scleral icterus.  Extraocular muscles intact.  HEENT: Head atraumatic, normocephalic. Oropharynx and nasopharynx clear. No oropharyngeal erythema, moist oral mucosa  NECK:  Supple, no jugular venous distention. No thyroid enlargement, no tenderness.  LUNGS: Normal breath sounds bilaterally, no wheezing, rales, rhonchi. No use of accessory muscles of respiration.  CARDIOVASCULAR: S1, S2 normal. No murmurs, rubs, or gallops.  ABDOMEN: Soft, nontender, nondistended. Bowel sounds present. No organomegaly or mass.  EXTREMITIES: No pedal edema, cyanosis, or clubbing. + 2 pedal & radial pulses b/l.   NEUROLOGIC: Cranial nerves II through XII are intact. Motor 5-/5 all over. Patient not reliable with his sensory exam. PSYCHIATRIC: The patient is alert and awake. Pleasantly confused  SKIN: No obvious rash, lesion, or ulcer.   LABORATORY PANEL:   CBC  Recent Labs Lab 03/06/15 1158  WBC 9.6  HGB 13.4  HCT 39.1*  PLT 240   ------------------------------------------------------------------------------------------------------------------  Chemistries   Recent Labs Lab 03/06/15 1158  NA 141  K 3.9  CL 101  CO2 30  GLUCOSE 88  BUN 19  CREATININE 1.00  CALCIUM 9.3   ------------------------------------------------------------------------------------------------------------------  Cardiac Enzymes  Recent Labs Lab 03/06/15 1158  TROPONINI <0.03   ------------------------------------------------------------------------------------------------------------------  RADIOLOGY:  Ct Angio Head W/cm &/or Wo Cm  03/06/2015  CLINICAL DATA:  Hypotensive episode. Patient was wobbly upon standing. Lethargy and confusion upon arrival in the ED. Mental status and vital signs now normalized. EXAM: CT ANGIOGRAPHY HEAD AND NECK TECHNIQUE: Multidetector CT imaging of the head and neck was performed using the standard protocol during bolus administration of intravenous contrast. Multiplanar CT image reconstructions and  MIPs were obtained to evaluate the vascular anatomy. Carotid stenosis measurements (when applicable) are obtained utilizing NASCET criteria, using the distal internal carotid diameter as the denominator. CONTRAST:  156mL OMNIPAQUE IOHEXOL 350 MG/ML SOLN COMPARISON:  Noncontrast CT head earlier today. MRI head 08/07/2014. FINDINGS: CT HEAD Calvarium and skull base: No fracture or destructive lesion. Mastoids and middle ears are grossly clear. Paranasal sinuses: Imaged portions are clear. Orbits: Negative. Brain: No evidence of acute abnormality, including acute infarct, hemorrhage, hydrocephalus, or mass lesion. Atrophy with chronic microvascular change. CTA NECK Aortic arch: Variant branching anatomy with LEFT vertebral arising directly from the arch. Transverse arch atheromatous calcification and mural great vessel calcification. Imaged portion shows no evidence of aneurysm or dissection. No significant stenosis of the major arch vessel origins. Right carotid system: Non stenotic calcific atheromatous change at the bifurcation. No evidence of dissection, stenosis (50% or greater) or occlusion. Left carotid system: Non stenotic calcific atheromatous change at the bifurcation. No evidence of dissection, stenosis (50% or greater) or occlusion. Vertebral arteries: LEFT dominant. No evidence of dissection, stenosis (50% or greater) or occlusion. Nonvascular soft tissues: Lung apices clear. No masses. Airway midline. 6 x 9  mm RIGHT thyroid cyst, non worrisome. Cervical spondylosis. No worrisome osseous lesion. CTA HEAD Anterior circulation: Non stenotic atheromatous calcification in the carotid siphons. No significant stenosis, proximal occlusion, aneurysm, or vascular malformation. With regard to the possible dense LEFT MCA, no large vessel occlusion or anterior circulation stenosis. Posterior circulation: Eccentric non stenotic calcific plaque both intracranial vertebral arteries. No significant stenosis, proximal  occlusion, aneurysm, or vascular malformation. Venous sinuses: As permitted by contrast timing, patent. Anatomic variants: None of significance. Delayed phase:   No abnormal intracranial enhancement. IMPRESSION: No intracranial large vessel occlusion. No abnormal postcontrast enhancement or evidence for acute cerebral infarction. Calcific non stenotic atheromatous change at the carotid bifurcations, both carotid siphons, and both distal vertebral arteries, without observable flow reducing lesion. Electronically Signed   By: Staci Righter M.D.   On: 03/06/2015 16:23   Dg Chest 2 View  03/06/2015  CLINICAL DATA:  Lethargy and confusion today. Possible hypotension. Initial encounter. EXAM: CHEST  2 VIEW COMPARISON:  None. FINDINGS: The lungs are clear. Heart size is normal. There is no pneumothorax or pleural effusion. No focal bony abnormality. IMPRESSION: No acute disease. Electronically Signed   By: Inge Rise M.D.   On: 03/06/2015 14:12   Ct Head Wo Contrast  03/06/2015  CLINICAL DATA:  Hypotensive episode with lethargy EXAM: CT HEAD WITHOUT CONTRAST TECHNIQUE: Contiguous axial images were obtained from the base of the skull through the vertex without intravenous contrast. COMPARISON:  08/07/14 FINDINGS: The bony calvarium is intact. No findings to suggest acute hemorrhage, acute infarction or space-occupying mass lesion are noted. Mild atrophic changes are noted commenced with the patient's given age. Some slight increased density is noted in the distribution of the left middle cerebral artery. This is of uncertain significance but may be related to acute thrombosis. Correlation with clinical symptomatology is recommended. The need for MRI can be determined on a clinical basis. IMPRESSION: Atrophic changes. Slight increased density in the left middle cerebral artery. This could represent acute thrombosis. Clinical correlation is recommended. Electronically Signed   By: Inez Catalina M.D.   On:  03/06/2015 13:48   Ct Angio Neck W/cm &/or Wo/cm  03/06/2015  CLINICAL DATA:  Hypotensive episode. Patient was wobbly upon standing. Lethargy and confusion upon arrival in the ED. Mental status and vital signs now normalized. EXAM: CT ANGIOGRAPHY HEAD AND NECK TECHNIQUE: Multidetector CT imaging of the head and neck was performed using the standard protocol during bolus administration of intravenous contrast. Multiplanar CT image reconstructions and MIPs were obtained to evaluate the vascular anatomy. Carotid stenosis measurements (when applicable) are obtained utilizing NASCET criteria, using the distal internal carotid diameter as the denominator. CONTRAST:  120mL OMNIPAQUE IOHEXOL 350 MG/ML SOLN COMPARISON:  Noncontrast CT head earlier today. MRI head 08/07/2014. FINDINGS: CT HEAD Calvarium and skull base: No fracture or destructive lesion. Mastoids and middle ears are grossly clear. Paranasal sinuses: Imaged portions are clear. Orbits: Negative. Brain: No evidence of acute abnormality, including acute infarct, hemorrhage, hydrocephalus, or mass lesion. Atrophy with chronic microvascular change. CTA NECK Aortic arch: Variant branching anatomy with LEFT vertebral arising directly from the arch. Transverse arch atheromatous calcification and mural great vessel calcification. Imaged portion shows no evidence of aneurysm or dissection. No significant stenosis of the major arch vessel origins. Right carotid system: Non stenotic calcific atheromatous change at the bifurcation. No evidence of dissection, stenosis (50% or greater) or occlusion. Left carotid system: Non stenotic calcific atheromatous change at the bifurcation. No evidence of dissection, stenosis (  50% or greater) or occlusion. Vertebral arteries: LEFT dominant. No evidence of dissection, stenosis (50% or greater) or occlusion. Nonvascular soft tissues: Lung apices clear. No masses. Airway midline. 6 x 9 mm RIGHT thyroid cyst, non worrisome. Cervical  spondylosis. No worrisome osseous lesion. CTA HEAD Anterior circulation: Non stenotic atheromatous calcification in the carotid siphons. No significant stenosis, proximal occlusion, aneurysm, or vascular malformation. With regard to the possible dense LEFT MCA, no large vessel occlusion or anterior circulation stenosis. Posterior circulation: Eccentric non stenotic calcific plaque both intracranial vertebral arteries. No significant stenosis, proximal occlusion, aneurysm, or vascular malformation. Venous sinuses: As permitted by contrast timing, patent. Anatomic variants: None of significance. Delayed phase:   No abnormal intracranial enhancement. IMPRESSION: No intracranial large vessel occlusion. No abnormal postcontrast enhancement or evidence for acute cerebral infarction. Calcific non stenotic atheromatous change at the carotid bifurcations, both carotid siphons, and both distal vertebral arteries, without observable flow reducing lesion. Electronically Signed   By: Staci Righter M.D.   On: 03/06/2015 16:23     IMPRESSION AND PLAN:   * TIA - Likely from hypotension -Check MRI of the brain - Start aspirin and statin. - Lovenox for DVT prophylaxis. - PT consult. - Neuro checks every 4 hours for 24 hours. - Consulted neurology. Discussed with Dr. Irish Elders. - Telemetry  * Hypotension Could be possibly from his Parkinson's disease and medications. Has improved with fluids. Monitor overnight. Patient's blood pressure is in the [id number] systolic at this point. We will add IV when necessary medications.  * Parkinson's disease Continue home medications  * Dementia Watch for inpatient delirium.  * DVT prophylaxis Lovenox   All the records are reviewed and case discussed with ED provider. Management plans discussed with the patient, family and they are in agreement.  CODE STATUS: FULL  TOTAL TIME TAKING CARE OF THIS PATIENT: 65 minutes.    Hillary Bow R M.D on 03/06/2015 at 4:37  PM  Between 7am to 6pm - Pager - [phone]  After 6pm go to [email or web address] - password [email or web address] [place] [facility]  Office  [phone]  CC: Primary care physician; Loistine Chance, MD  Note: This dictation was prepared with Dragon dictation along with smaller phrase technology. Any transcriptional errors that result from this process are unintentional.

## 2015-03-06 NOTE — Progress Notes (Signed)

## 2015-03-06 NOTE — Telephone Encounter (Signed)
Shane Mooney from [facility]/called to inform that 911 was called to pt's home/ and pt in route to [facility]/ER//due to low BP of 64/42

## 2015-03-06 NOTE — Telephone Encounter (Signed)
At 10:35 a.m. I received at call from Elder Cyphers from patient's physical therapist calling to state while working with patient he seemed to be lethargic and his blood pressure was low reading 64/42. I asked if the patient was had anything to eat this morning or if he took his medication, the therapist stated he indeed ate a english muffin and OJ and took his medication. I told them I would inform Dr. Ancil Boozer of patient condition and call right back with instructions of what to do. I informed Dr. Ancil Boozer and she vocalized with out seeing the patient she couldn't diagnosed if it was due to infection or being septic. She advise going to Urgent Care or going to ED. I called Elder Cyphers back at 10:39 a.m. with Dr. Ancil Boozer advice and she agreed. Mrs. Collins let Mr. Ines and his wife hear Dr. Ancil Boozer advice and agreed to call 911 after hanging up with me.

## 2015-03-06 NOTE — ED Provider Notes (Signed)
[facility] Emergency Department Provider Note  ____________________________________________  Time seen: Approximately 1:09 PM  I have reviewed the triage vital signs and the nursing notes.   HISTORY  Chief Complaint Hypotension    HPI Shane Mooney. is a 74 y.o. male with history of Parkinson's dementia presents for evaluation of intermittent lethargy and hypotension, gradual onset since yesterday, intermittent, currently resolved. Wife reports that yesterday when the patient's speech therapist came to evaluate him, he was lethargic and unable to cooperate in her assessment. His blood pressure was "80s over something at that time". He has intermittently appeared somewhat confused and lethargic since then. Today, his physical therapist came to check on him and his blood pressure was 64/42 and he again appeared confused. He was "wobbly and lethargic"  on the arrival of EMS at which point his blood pressure was 108/78 while sitting and dropped to 86/64 with standing. He required significant assistance to even stand up. He has had no vomiting, diarrhea, fevers or chills. No chest pain or difficulty breathing. No recent changes in medications. Currently he is asymptomatic.   Past Medical History  Diagnosis Date  . Ulcer   . Hernia     LIH  . 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 Cancer ([place]) 2013    basal cell carcinoma back and face  . Personal history of colonic polyps   . Parkinson disease [facility]) April 2014  . Dementia     Patient Active Problem List   Diagnosis Date Noted  . Personal history of colonic polyps 09/05/2011    Past Surgical History  Procedure Laterality Date  . Colonoscopy  2012    Dr Bary Castilla  . Hernia repair  2000    LIH  . Repair of perforated ulcer  1994  . Cholecystectomy  2004  . Hemicolectomy Right 08/2011  . Skin cancer excision Left 2013    neck  . Polypectomy  2006     benign    Current Outpatient Rx   Name  Route  Sig  Dispense  Refill  . carbidopa-levodopa (SINEMET IR) 25-100 MG tablet   Oral   Take 0.5 tablets by mouth 3 (three) times daily.   45 tablet   5   . Cholecalciferol (VITAMIN D PO)   Oral   Take 2,000 Units by mouth daily.         . Cyanocobalamin (VITAMIN B-12 PO)   Oral   Take 1,000 mcg by mouth daily.         . Melatonin 3 MG CAPS   Oral   Take by mouth at bedtime.         Marland Kitchen QUEtiapine (SEROQUEL) 25 MG tablet      1 tablet in the morning, 1/2 tablet in the afternoon, 1 at night. Patient can take an extra 1/2 tablet daily as needed   90 tablet   5   . rivastigmine (EXELON) 9.5 mg/24hr   Transdermal   Place 1 patch (9.5 mg total) onto the skin daily.   30 patch   5     Allergies Review of patient's allergies indicates no known allergies.  No family history on file.  Social History Social History  Substance Use Topics  . Smoking status: Former Smoker -- 0.50 packs/day for 10 years    Types: Cigarettes    Quit date: 06/05/1974  . Smokeless tobacco: Never Used  . Alcohol Use: No    Review of Systems Constitutional: No  fever/chills Eyes: No visual changes. ENT: No sore throat. Cardiovascular: Denies chest pain. Respiratory: Denies shortness of breath. Gastrointestinal: No abdominal pain.  No nausea, no vomiting.  No diarrhea.  No constipation. Genitourinary: Negative for dysuria. Musculoskeletal: Negative for back pain. Skin: Negative for rash. Neurological: Negative for headaches, focal weakness or numbness.  10-point ROS otherwise negative.  ____________________________________________   PHYSICAL EXAM:  VITAL SIGNS: ED Triage Vitals  Enc Vitals Group     BP 03/06/15 1140 162/86 mmHg     Pulse Rate 03/06/15 1140 62     Resp --      Temp --      Temp src --      SpO2 03/06/15 1140 99 %     Weight 03/06/15 1140 165 lb (74.844 kg)     Height 03/06/15 1140 5\' 10"  (1.778 m)     Head Cir --      Peak Flow --      Pain  Score --      Pain Loc --      Pain Edu? --      Excl. in [place]? --     Constitutional: Alert and oriented to place and to self but not to year which wife reports is his baseline.Nontoxic-appearing and in no acute distress. Eyes: Conjunctivae are normal. PERRL. EOMI. Head: Atraumatic. Nose: No congestion/rhinnorhea. Mouth/Throat: Mucous membranes are moist.  Oropharynx non-erythematous. Neck: No stridor.   Cardiovascular: Normal rate, regular rhythm. Grossly normal heart sounds.  Good peripheral circulation. Respiratory: Normal respiratory effort.  No retractions. Lungs CTAB. Gastrointestinal: Soft and nontender. No distention.  No CVA tenderness. Genitourinary: deferred Musculoskeletal: No lower extremity tenderness nor edema.  No joint effusions. Neurologic:  Normal speech and language. No gross focal neurologic deficits are appreciated. 5 out of 5 strength in bilateral upper and lower extremity, sensation intact to light touch throughout, cranial nerves II through XII intact through there is mild right facial droop, this appears symmetric when he actively smiled. Normal finger-nose-finger. Skin:  Skin is warm, dry and intact. No rash noted. Psychiatric: Mood and affect are normal. Speech and behavior are normal.  ____________________________________________   LABS (all labs ordered are listed, but only abnormal results are displayed)  Labs Reviewed  CBC - Abnormal; Notable for the following:    RBC 4.29 (*)    HCT 39.1 (*)    All other components within normal limits  URINALYSIS COMPLETEWITH MICROSCOPIC ([facility] ONLY) - Abnormal; Notable for the following:    Color, Urine YELLOW (*)    APPearance CLEAR (*)    Ketones, ur TRACE (*)    Protein, ur 30 (*)    All other components within normal limits  BASIC METABOLIC PANEL  TROPONIN I   ____________________________________________  EKG  ED ECG REPORT I, Joanne Gavel, the attending physician, personally viewed and interpreted  this ECG.   Date: 03/06/2015  EKG Time: 11:34  Rate: 69  Rhythm: normal sinus rhythm  Axis: normal  Intervals:none  ST&T Change: No acute ST elevation.  ____________________________________________  RADIOLOGY  CXR No acute disease.  CT head FINDINGS: The bony calvarium is intact. No findings to suggest acute hemorrhage, acute infarction or space-occupying mass lesion are noted. Mild atrophic changes are noted commenced with the patient's given age. Some slight increased density is noted in the distribution of the left middle cerebral artery. This is of uncertain significance but may be related to acute thrombosis. Correlation with clinical symptomatology is recommended. The need for MRI  can be determined on a clinical basis.  IMPRESSION: Atrophic changes.  Slight increased density in the left middle cerebral artery. This could represent acute thrombosis. Clinical correlation is recommended. ____________________________________________   PROCEDURES  Procedure(s) performed: None  Critical Care performed: No  ____________________________________________   INITIAL IMPRESSION / ASSESSMENT AND PLAN / ED COURSE  Pertinent labs & imaging results that were available during my care of the patient were reviewed by me and considered in my medical decision making (see chart for details).  Cleo Byron. is a 74 y.o. male with history of Parkinson's dementia presents for evaluation of intermittent lethargy and hypotension, resolved. On arrival to the emergency department, he reports he feels well, blood pressure 162/86, intact neurological examination and he is asymptomatic. Suspect with a static hypotension given hypotension with position change. Will give light IV fluids. Will obtain labs, urinalysis, chest x-ray and CT head. Reassess for disposition.  ----------------------------------------- 2:24 PM on 03/06/2015 ----------------------------------------- Labs  reviewed. Normal CBC, BMP, negative troponin. Urinalysis is not consistent with urinary tract infection. Chest x-ray clear. CT scan of the head shows increased density in the left MCA which could represent acute thrombosis. Patient had mild facial symmetry on my assessment but this seems to normalize with smile. Otherwise his neurological examination is intact, NIH stroke scale is 0. We'll give aspirin. No indication for TPA. Case discussed with hospitalist for admission. Of note, the patient has had no hypotension since arrival to the emergency department, including no postural hypotension.  ____________________________________________   FINAL CLINICAL IMPRESSION(S) / ED DIAGNOSES  Final diagnoses:  Cerebral infarction due to unspecified mechanism      Joanne Gavel, MD 03/06/15 1427

## 2015-03-06 NOTE — ED Notes (Signed)
Pt in via EMS. Wife called out this morning due to a hypotensive episode; reported BP of 64/42 check by a home health physical therapist.  Fire gort a manual BP of 110/70 upon arrival.  EMS got a manual BP of 108/78 sitting and 86/64 standing.  EMS reports pt was lethargic and confused upon arrival.  Pt states he was "wobbly" upon standing.  Pt is now A/OX4, vitals WDL, no immediate distress at this time.

## 2015-03-06 NOTE — Progress Notes (Signed)
Patient seen and examined. Poor historian with his dementia. Presented with lethargy and hypotension. Concern for some facial droop per ER. Wife at bedside says he has been more wobbly at home. BP is better.  Neuro Motor 5-/5 Upper and lower bilateral. Sensations intact but patient not reliable. Mild Left facial droop. Gait not tested.  CT head showed possible thrombus in L MCA. Dicussed with Dr. Irish Elders. Advised CTA head STAT that i have ordered. Discussed with Dr. Edd Fabian in ED.  Discussed with Wife at bedside and questions answered.

## 2015-03-06 NOTE — Telephone Encounter (Signed)
FYI

## 2015-03-06 NOTE — ED Notes (Signed)
Patient transported to CT 

## 2015-03-07 ENCOUNTER — Observation Stay (HOSPITAL_BASED_OUTPATIENT_CLINIC_OR_DEPARTMENT_OTHER)
Admit: 2015-03-07 | Discharge: 2015-03-07 | Disposition: A | Discharge status: Home / Self Care | Payer: Medicare Other | Attending: Internal Medicine | Admitting: Internal Medicine

## 2015-03-07 ENCOUNTER — Observation Stay: Payer: Medicare Other

## 2015-03-07 DIAGNOSIS — G459 Transient cerebral ischemic attack, unspecified: Secondary | ICD-10-CM | POA: Diagnosis not present

## 2015-03-07 DIAGNOSIS — G451 Carotid artery syndrome (hemispheric): Secondary | ICD-10-CM

## 2015-03-07 DIAGNOSIS — I6529 Occlusion and stenosis of unspecified carotid artery: Secondary | ICD-10-CM | POA: Diagnosis present

## 2015-03-07 LAB — BASIC METABOLIC PANEL
ANION GAP: 7 (ref 5–15)
BUN: 19 mg/dL (ref 6–20)
CALCIUM: 9.2 mg/dL (ref 8.9–10.3)
CO2: 28 mmol/L (ref 22–32)
Chloride: 107 mmol/L (ref 101–111)
Creatinine, Ser: 1.26 mg/dL — ABNORMAL HIGH (ref 0.61–1.24)
GFR, EST NON AFRICAN AMERICAN: 55 mL/min — AB (ref >60)
GLUCOSE: 117 mg/dL — AB (ref 65–99)
POTASSIUM: 3.6 mmol/L (ref 3.5–5.1)
SODIUM: 142 mmol/L (ref 135–145)

## 2015-03-07 LAB — CBC
HEMATOCRIT: 39.7 % — AB (ref 40.0–52.0)
HEMOGLOBIN: 13.8 g/dL (ref 13.0–18.0)
MCH: 31.9 pg (ref 26.0–34.0)
MCHC: 34.9 g/dL (ref 32.0–36.0)
MCV: 91.4 fL (ref 80.0–100.0)
Platelets: 242 10*3/uL (ref 150–440)
RBC: 4.34 MIL/uL — AB (ref 4.40–5.90)
RDW: 14.2 % (ref 11.5–14.5)
WBC: 14.9 10*3/uL — AB (ref 3.8–10.6)

## 2015-03-07 MED ORDER — ASPIRIN 81 MG PO TBEC: 81.0000 mg | DELAYED_RELEASE_TABLET | Freq: Every day | ORAL | Status: DC

## 2015-03-07 MED ORDER — ATORVASTATIN CALCIUM 10 MG PO TABS: 10.0000 mg | ORAL_TABLET | Freq: Every day | ORAL | Status: DC

## 2015-03-07 MED ORDER — LORAZEPAM 2 MG/ML IJ SOLN
0.2500 mg | Freq: Once | INTRAMUSCULAR | Status: AC
  Administered 2015-03-07: 10:00:00 0.25 mg via INTRAVENOUS
  Filled 2015-03-07: qty 1

## 2015-03-07 NOTE — Plan of Care (Signed)
Problem: Education: Goal: Knowledge of [facility] Education information/materials will improve Outcome: Progressing Pt likes to be called Shane Mooney. Pt lives with wife    Past Medical History   Diagnosis  Date   .  Ulcer     .  Hernia         LIH   .  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  Cancer ([place])  2013       basal cell carcinoma back and face   .  Personal history of colonic polyps     .  Parkinson disease [facility])  April 2014   .  Dementia       Pt is well controlled with home medications  Problem: Activity: Goal: Risk for activity intolerance will decrease Outcome: Progressing Pt alert and oriented. Wife at bedside. No new neuro deficits noted during this shift. Continues on Q2 neuro checks. Continue to monitor.

## 2015-03-07 NOTE — Progress Notes (Signed)
MD making rounds. Discharge orders received. Appointment Scheduled. Telemetry Removed. IV removed. Prescriptions E-Prescribed to Pharmacy. Discharge paperwork provided, explained, signed and witnessed. Education handouts provided to patient. No unanswered questions. Escorted via wheelchair by nursing staff. All belongings sent with patient and family.

## 2015-03-07 NOTE — Consult Note (Signed)
CC: L facial droop  HPI: Shane Mooney. is an 74 y.o. male with history of PD, lewy body dementia presenting with orthostatic hypotension    Past Medical History  Diagnosis Date  . Ulcer   . Hernia     LIH  . 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 Cancer ([place]) 2013    basal cell carcinoma back and face  . Personal history of colonic polyps   . Parkinson disease [facility]) April 2014  . Dementia     Past Surgical History  Procedure Laterality Date  . Colonoscopy  2012    Dr Bary Castilla  . Hernia repair  2000    LIH  . Repair of perforated ulcer  1994  . Cholecystectomy  2004  . Hemicolectomy Right 08/2011  . Skin cancer excision Left 2013    neck  . Polypectomy  2006     benign    History reviewed. No pertinent family history.  Social History:  reports that he quit smoking about 40 years ago. His smoking use included Cigarettes. He has a 5 pack-year smoking history. He has never used smokeless tobacco. He reports that he does not drink alcohol or use illicit drugs.  No Known Allergies  Medications: I have reviewed the patient's current medications.   Physical Examination: Blood pressure 160/71, pulse 88, temperature 98.9 F (37.2 C), temperature source Oral, resp. rate 20, height 5\' 10"  (1.778 m), weight 161 lb 4.8 oz (73.165 kg), SpO2 98 %.   Neurological Examination Mental Status: Alert, oriented. Follows 2 step commands.    Cranial Nerves: II: Discs flat bilaterally; Visual fields grossly normal, pupils equal, round, reactive to light and accommodation III,IV, VI: ptosis not present, extra-ocular motions intact bilaterally V,VII: L facial droop VIII: hearing normal bilaterally IX,X: gag reflex present XI: bilateral shoulder shrug XII: midline tongue extension Motor: Right : Upper extremity   4/5    Left:     Upper extremity   4/5  Lower extremity   4/5     Lower extremity   4/5 Tone and bulk:normal tone throughout; no atrophy noted Sensory:  Pinprick and light touch intact throughout, bilaterally Plantars: Right: downgoing   Left: downgoing Cerebellar: Not tested.  Gait: not able to examine.         Laboratory Studies:   Basic Metabolic Panel:  Recent Labs Lab 03/06/15 1158 03/07/15 0456  NA 141 142  K 3.9 3.6  CL 101 107  CO2 30 28  GLUCOSE 88 117*  BUN 19 19  CREATININE 1.00 1.26*  CALCIUM 9.3 9.2    Liver Function Tests: No results for input(s): AST, ALT, ALKPHOS, BILITOT, PROT, ALBUMIN in the last 168 hours. No results for input(s): LIPASE, AMYLASE in the last 168 hours. No results for input(s): AMMONIA in the last 168 hours.  CBC:  Recent Labs Lab 03/06/15 1158 03/07/15 0456  WBC 9.6 14.9*  HGB 13.4 13.8  HCT 39.1* 39.7*  MCV 91.3 91.4  PLT 240 242    Cardiac Enzymes:  Recent Labs Lab 03/06/15 1158  TROPONINI <0.03    BNP: Invalid input(s): POCBNP  CBG: No results for input(s): GLUCAP in the last 168 hours.  Microbiology: No results found for this or any previous visit.  Coagulation Studies: No results for input(s): LABPROT, INR in the last 72 hours.  Urinalysis:  Recent Labs Lab 03/06/15 1224  COLORURINE YELLOW*  LABSPEC 1.016  PHURINE 5.0  GLUCOSEU NEGATIVE  HGBUR NEGATIVE  BILIRUBINUR NEGATIVE  KETONESUR TRACE*  PROTEINUR 30*  NITRITE NEGATIVE  LEUKOCYTESUR NEGATIVE    Lipid Panel:  No results found for: CHOL, TRIG, HDL, CHOLHDL, VLDL, LDLCALC  HgbA1C: No results found for: HGBA1C  Urine Drug Screen:  No results found for: LABOPIA, COCAINSCRNUR, LABBENZ, AMPHETMU, THCU, LABBARB  Alcohol Level: No results for input(s): ETH in the last 168 hours.  Other results: EKG: normal EKG, normal sinus rhythm, unchanged from previous tracings.  Imaging: Ct Angio Head W/cm &/or Wo Cm  03/06/2015  CLINICAL DATA:  Hypotensive episode. Patient was wobbly upon standing. Lethargy and confusion upon arrival in the ED. Mental status and vital signs now normalized. EXAM: CT  ANGIOGRAPHY HEAD AND NECK TECHNIQUE: Multidetector CT imaging of the head and neck was performed using the standard protocol during bolus administration of intravenous contrast. Multiplanar CT image reconstructions and MIPs were obtained to evaluate the vascular anatomy. Carotid stenosis measurements (when applicable) are obtained utilizing NASCET criteria, using the distal internal carotid diameter as the denominator. CONTRAST:  191mL OMNIPAQUE IOHEXOL 350 MG/ML SOLN COMPARISON:  Noncontrast CT head earlier today. MRI head 08/07/2014. FINDINGS: CT HEAD Calvarium and skull base: No fracture or destructive lesion. Mastoids and middle ears are grossly clear. Paranasal sinuses: Imaged portions are clear. Orbits: Negative. Brain: No evidence of acute abnormality, including acute infarct, hemorrhage, hydrocephalus, or mass lesion. Atrophy with chronic microvascular change. CTA NECK Aortic arch: Variant branching anatomy with LEFT vertebral arising directly from the arch. Transverse arch atheromatous calcification and mural great vessel calcification. Imaged portion shows no evidence of aneurysm or dissection. No significant stenosis of the major arch vessel origins. Right carotid system: Non stenotic calcific atheromatous change at the bifurcation. No evidence of dissection, stenosis (50% or greater) or occlusion. Left carotid system: Non stenotic calcific atheromatous change at the bifurcation. No evidence of dissection, stenosis (50% or greater) or occlusion. Vertebral arteries: LEFT dominant. No evidence of dissection, stenosis (50% or greater) or occlusion. Nonvascular soft tissues: Lung apices clear. No masses. Airway midline. 6 x 9 mm RIGHT thyroid cyst, non worrisome. Cervical spondylosis. No worrisome osseous lesion. CTA HEAD Anterior circulation: Non stenotic atheromatous calcification in the carotid siphons. No significant stenosis, proximal occlusion, aneurysm, or vascular malformation. With regard to the  possible dense LEFT MCA, no large vessel occlusion or anterior circulation stenosis. Posterior circulation: Eccentric non stenotic calcific plaque both intracranial vertebral arteries. No significant stenosis, proximal occlusion, aneurysm, or vascular malformation. Venous sinuses: As permitted by contrast timing, patent. Anatomic variants: None of significance. Delayed phase:   No abnormal intracranial enhancement. IMPRESSION: No intracranial large vessel occlusion. No abnormal postcontrast enhancement or evidence for acute cerebral infarction. Calcific non stenotic atheromatous change at the carotid bifurcations, both carotid siphons, and both distal vertebral arteries, without observable flow reducing lesion. Electronically Signed   By: Staci Righter M.D.   On: 03/06/2015 16:23   Dg Chest 2 View  03/06/2015  CLINICAL DATA:  Lethargy and confusion today. Possible hypotension. Initial encounter. EXAM: CHEST  2 VIEW COMPARISON:  None. FINDINGS: The lungs are clear. Heart size is normal. There is no pneumothorax or pleural effusion. No focal bony abnormality. IMPRESSION: No acute disease. Electronically Signed   By: Inge Rise M.D.   On: 03/06/2015 14:12   Ct Head Wo Contrast  03/06/2015  CLINICAL DATA:  Hypotensive episode with lethargy EXAM: CT HEAD WITHOUT CONTRAST TECHNIQUE: Contiguous axial images were obtained from the base of the skull through the vertex without intravenous contrast. COMPARISON:  08/07/14 FINDINGS: The  bony calvarium is intact. No findings to suggest acute hemorrhage, acute infarction or space-occupying mass lesion are noted. Mild atrophic changes are noted commenced with the patient's given age. Some slight increased density is noted in the distribution of the left middle cerebral artery. This is of uncertain significance but may be related to acute thrombosis. Correlation with clinical symptomatology is recommended. The need for MRI can be determined on a clinical basis.  IMPRESSION: Atrophic changes. Slight increased density in the left middle cerebral artery. This could represent acute thrombosis. Clinical correlation is recommended. Electronically Signed   By: Inez Catalina M.D.   On: 03/06/2015 13:48   Ct Angio Neck W/cm &/or Wo/cm  03/06/2015  CLINICAL DATA:  Hypotensive episode. Patient was wobbly upon standing. Lethargy and confusion upon arrival in the ED. Mental status and vital signs now normalized. EXAM: CT ANGIOGRAPHY HEAD AND NECK TECHNIQUE: Multidetector CT imaging of the head and neck was performed using the standard protocol during bolus administration of intravenous contrast. Multiplanar CT image reconstructions and MIPs were obtained to evaluate the vascular anatomy. Carotid stenosis measurements (when applicable) are obtained utilizing NASCET criteria, using the distal internal carotid diameter as the denominator. CONTRAST:  147mL OMNIPAQUE IOHEXOL 350 MG/ML SOLN COMPARISON:  Noncontrast CT head earlier today. MRI head 08/07/2014. FINDINGS: CT HEAD Calvarium and skull base: No fracture or destructive lesion. Mastoids and middle ears are grossly clear. Paranasal sinuses: Imaged portions are clear. Orbits: Negative. Brain: No evidence of acute abnormality, including acute infarct, hemorrhage, hydrocephalus, or mass lesion. Atrophy with chronic microvascular change. CTA NECK Aortic arch: Variant branching anatomy with LEFT vertebral arising directly from the arch. Transverse arch atheromatous calcification and mural great vessel calcification. Imaged portion shows no evidence of aneurysm or dissection. No significant stenosis of the major arch vessel origins. Right carotid system: Non stenotic calcific atheromatous change at the bifurcation. No evidence of dissection, stenosis (50% or greater) or occlusion. Left carotid system: Non stenotic calcific atheromatous change at the bifurcation. No evidence of dissection, stenosis (50% or greater) or occlusion. Vertebral  arteries: LEFT dominant. No evidence of dissection, stenosis (50% or greater) or occlusion. Nonvascular soft tissues: Lung apices clear. No masses. Airway midline. 6 x 9 mm RIGHT thyroid cyst, non worrisome. Cervical spondylosis. No worrisome osseous lesion. CTA HEAD Anterior circulation: Non stenotic atheromatous calcification in the carotid siphons. No significant stenosis, proximal occlusion, aneurysm, or vascular malformation. With regard to the possible dense LEFT MCA, no large vessel occlusion or anterior circulation stenosis. Posterior circulation: Eccentric non stenotic calcific plaque both intracranial vertebral arteries. No significant stenosis, proximal occlusion, aneurysm, or vascular malformation. Venous sinuses: As permitted by contrast timing, patent. Anatomic variants: None of significance. Delayed phase:   No abnormal intracranial enhancement. IMPRESSION: No intracranial large vessel occlusion. No abnormal postcontrast enhancement or evidence for acute cerebral infarction. Calcific non stenotic atheromatous change at the carotid bifurcations, both carotid siphons, and both distal vertebral arteries, without observable flow reducing lesion. Electronically Signed   By: Staci Righter M.D.   On: 03/06/2015 16:23   Mr Brain Wo Contrast  03/07/2015  CLINICAL DATA:  TIA. Hypotension, dizziness, and lightheaded yesterday. Left-sided facial droop. History of dementia and Parkinson's disease. EXAM: MRI HEAD WITHOUT CONTRAST TECHNIQUE: Multiplanar, multiecho pulse sequences of the brain and surrounding structures were obtained without intravenous contrast. COMPARISON:  Head CT and CTA 03/06/2015.  Head MRI 08/07/2014. FINDINGS: There is no evidence of acute infarct, intracranial hemorrhage, mass, midline shift, or extra-axial fluid collection. Mild to  moderate cerebral atrophy is unchanged. T2 hyperintensities in the subcortical and deep cerebral white matter and in the pons have not significantly changed  from the prior MRI and are compatible with mild chronic small vessel ischemic disease. Orbits are unremarkable. Paranasal sinuses and mastoid air cells are clear. Major intracranial vascular flow voids are preserved. IMPRESSION: 1. No acute intracranial abnormality. 2. 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 Electronically Signed   By: Logan Bores M.D.   On: 03/07/2015 11:38     Assessment/Plan:  Pleasant Dobin. is an 74 y.o. male with history of PD, lewy body dementia presenting with orthostatic hypotension   MRI brain small vessel dz.  Facial droop possibly from hypoperfusion which is a combination of Lewy body dementia, PD and dopamine agents. Would consider midodrine.  D/c planning from neuro stand point Hydration Zaydn Gutridge   03/07/2015, 2:20 PM

## 2015-03-07 NOTE — Progress Notes (Signed)
[facility] [place] NAME: Shane Mooney    MR#:  [id number]  DATE OF BIRTH:  01/03/1941  SUBJECTIVE:   No acute events overnight. Patient is demented. Wife at bedside.  REVIEW OF SYSTEMS:    Review of Systems  Unable to perform ROS: dementia    Tolerating Diet: Yes      DRUG ALLERGIES:  No Known Allergies  VITALS:  Blood pressure 155/75, pulse 77, temperature 98.1 F (36.7 C), temperature source Oral, resp. rate 18, height 5\' 10"  (1.778 m), weight 73.165 kg (161 lb 4.8 oz), SpO2 97 %.  PHYSICAL EXAMINATION:   Physical Exam  Constitutional: He is well-developed, well-nourished, and in no distress. No distress.  HENT:  Head: Normocephalic.  Eyes: No scleral icterus.  Neck: Normal range of motion. Neck supple. No JVD present. No tracheal deviation present.  Cardiovascular: Normal rate, regular rhythm and normal heart sounds.  Exam reveals no gallop and no friction rub.   No murmur heard. Pulmonary/Chest: Effort normal and breath sounds normal. No respiratory distress. He has no wheezes. He has no rales. He exhibits no tenderness.  Abdominal: Soft. Bowel sounds are normal. He exhibits no distension and no mass. There is no tenderness. There is no rebound and no guarding.  Musculoskeletal: Normal range of motion. He exhibits no edema.  Neurological: He is alert.  Follows all commands but not oriented  Skin: Skin is warm. No rash noted. No erythema.      LABORATORY PANEL:   CBC  Recent Labs Lab 03/07/15 0456  WBC 14.9*  HGB 13.8  HCT 39.7*  PLT 242   ------------------------------------------------------------------------------------------------------------------  Chemistries   Recent Labs Lab 03/07/15 0456  NA 142  K 3.6  CL 107  CO2 28  GLUCOSE 117*  BUN 19  CREATININE 1.26*  CALCIUM 9.2    ------------------------------------------------------------------------------------------------------------------  Cardiac Enzymes  Recent Labs Lab 03/06/15 1158  TROPONINI <0.03   ------------------------------------------------------------------------------------------------------------------  RADIOLOGY:  Ct Angio Head W/cm &/or Wo Cm  03/06/2015  CLINICAL DATA:  Hypotensive episode. Patient was wobbly upon standing. Lethargy and confusion upon arrival in the ED. Mental status and vital signs now normalized. EXAM: CT ANGIOGRAPHY HEAD AND NECK TECHNIQUE: Multidetector CT imaging of the head and neck was performed using the standard protocol during bolus administration of intravenous contrast. Multiplanar CT image reconstructions and MIPs were obtained to evaluate the vascular anatomy. Carotid stenosis measurements (when applicable) are obtained utilizing NASCET criteria, using the distal internal carotid diameter as the denominator. CONTRAST:  168mL OMNIPAQUE IOHEXOL 350 MG/ML SOLN COMPARISON:  Noncontrast CT head earlier today. MRI head 08/07/2014. FINDINGS: CT HEAD Calvarium and skull base: No fracture or destructive lesion. Mastoids and middle ears are grossly clear. Paranasal sinuses: Imaged portions are clear. Orbits: Negative. Brain: No evidence of acute abnormality, including acute infarct, hemorrhage, hydrocephalus, or mass lesion. Atrophy with chronic microvascular change. CTA NECK Aortic arch: Variant branching anatomy with LEFT vertebral arising directly from the arch. Transverse arch atheromatous calcification and mural great vessel calcification. Imaged portion shows no evidence of aneurysm or dissection. No significant stenosis of the major arch vessel origins. Right carotid system: Non stenotic calcific atheromatous change at the bifurcation. No evidence of dissection, stenosis (50% or greater) or occlusion. Left carotid system: Non stenotic calcific atheromatous change at the  bifurcation. No evidence of dissection, stenosis (50% or greater) or occlusion. Vertebral arteries: LEFT dominant. No evidence of dissection, stenosis (50% or greater) or occlusion. Nonvascular soft  tissues: Lung apices clear. No masses. Airway midline. 6 x 9 mm RIGHT thyroid cyst, non worrisome. Cervical spondylosis. No worrisome osseous lesion. CTA HEAD Anterior circulation: Non stenotic atheromatous calcification in the carotid siphons. No significant stenosis, proximal occlusion, aneurysm, or vascular malformation. With regard to the possible dense LEFT MCA, no large vessel occlusion or anterior circulation stenosis. Posterior circulation: Eccentric non stenotic calcific plaque both intracranial vertebral arteries. No significant stenosis, proximal occlusion, aneurysm, or vascular malformation. Venous sinuses: As permitted by contrast timing, patent. Anatomic variants: None of significance. Delayed phase:   No abnormal intracranial enhancement. IMPRESSION: No intracranial large vessel occlusion. No abnormal postcontrast enhancement or evidence for acute cerebral infarction. Calcific non stenotic atheromatous change at the carotid bifurcations, both carotid siphons, and both distal vertebral arteries, without observable flow reducing lesion. Electronically Signed   By: Staci Righter M.D.   On: 03/06/2015 16:23   Dg Chest 2 View  03/06/2015  CLINICAL DATA:  Lethargy and confusion today. Possible hypotension. Initial encounter. EXAM: CHEST  2 VIEW COMPARISON:  None. FINDINGS: The lungs are clear. Heart size is normal. There is no pneumothorax or pleural effusion. No focal bony abnormality. IMPRESSION: No acute disease. Electronically Signed   By: Inge Rise M.D.   On: 03/06/2015 14:12   Ct Head Wo Contrast  03/06/2015  CLINICAL DATA:  Hypotensive episode with lethargy EXAM: CT HEAD WITHOUT CONTRAST TECHNIQUE: Contiguous axial images were obtained from the base of the skull through the vertex without  intravenous contrast. COMPARISON:  08/07/14 FINDINGS: The bony calvarium is intact. No findings to suggest acute hemorrhage, acute infarction or space-occupying mass lesion are noted. Mild atrophic changes are noted commenced with the patient's given age. Some slight increased density is noted in the distribution of the left middle cerebral artery. This is of uncertain significance but may be related to acute thrombosis. Correlation with clinical symptomatology is recommended. The need for MRI can be determined on a clinical basis. IMPRESSION: Atrophic changes. Slight increased density in the left middle cerebral artery. This could represent acute thrombosis. Clinical correlation is recommended. Electronically Signed   By: Inez Catalina M.D.   On: 03/06/2015 13:48   Ct Angio Neck W/cm &/or Wo/cm  03/06/2015  CLINICAL DATA:  Hypotensive episode. Patient was wobbly upon standing. Lethargy and confusion upon arrival in the ED. Mental status and vital signs now normalized. EXAM: CT ANGIOGRAPHY HEAD AND NECK TECHNIQUE: Multidetector CT imaging of the head and neck was performed using the standard protocol during bolus administration of intravenous contrast. Multiplanar CT image reconstructions and MIPs were obtained to evaluate the vascular anatomy. Carotid stenosis measurements (when applicable) are obtained utilizing NASCET criteria, using the distal internal carotid diameter as the denominator. CONTRAST:  172mL OMNIPAQUE IOHEXOL 350 MG/ML SOLN COMPARISON:  Noncontrast CT head earlier today. MRI head 08/07/2014. FINDINGS: CT HEAD Calvarium and skull base: No fracture or destructive lesion. Mastoids and middle ears are grossly clear. Paranasal sinuses: Imaged portions are clear. Orbits: Negative. Brain: No evidence of acute abnormality, including acute infarct, hemorrhage, hydrocephalus, or mass lesion. Atrophy with chronic microvascular change. CTA NECK Aortic arch: Variant branching anatomy with LEFT vertebral  arising directly from the arch. Transverse arch atheromatous calcification and mural great vessel calcification. Imaged portion shows no evidence of aneurysm or dissection. No significant stenosis of the major arch vessel origins. Right carotid system: Non stenotic calcific atheromatous change at the bifurcation. No evidence of dissection, stenosis (50% or greater) or occlusion. Left carotid system: Non stenotic  calcific atheromatous change at the bifurcation. No evidence of dissection, stenosis (50% or greater) or occlusion. Vertebral arteries: LEFT dominant. No evidence of dissection, stenosis (50% or greater) or occlusion. Nonvascular soft tissues: Lung apices clear. No masses. Airway midline. 6 x 9 mm RIGHT thyroid cyst, non worrisome. Cervical spondylosis. No worrisome osseous lesion. CTA HEAD Anterior circulation: Non stenotic atheromatous calcification in the carotid siphons. No significant stenosis, proximal occlusion, aneurysm, or vascular malformation. With regard to the possible dense LEFT MCA, no large vessel occlusion or anterior circulation stenosis. Posterior circulation: Eccentric non stenotic calcific plaque both intracranial vertebral arteries. No significant stenosis, proximal occlusion, aneurysm, or vascular malformation. Venous sinuses: As permitted by contrast timing, patent. Anatomic variants: None of significance. Delayed phase:   No abnormal intracranial enhancement. IMPRESSION: No intracranial large vessel occlusion. No abnormal postcontrast enhancement or evidence for acute cerebral infarction. Calcific non stenotic atheromatous change at the carotid bifurcations, both carotid siphons, and both distal vertebral arteries, without observable flow reducing lesion. Electronically Signed   By: Staci Righter M.D.   On: 03/06/2015 16:23     ASSESSMENT AND PLAN:   74 year old male with known history of Parkinson's disease and dementia who presented with orthostatic hypotension. He was found  to have an abnormality and head CT scan. For further details please further H&P.  1. Orthostatic hypotension: This is due to his Parkinson's disease and medications. There may be a slight component of dehydration. Blood pressure has improved but he remains orthostatic especially from sitting to standing. Patient's wife was provided orthostatic hypotension instructions.  2. Parkinson disease: Patient will continue Sinemet and Exelon patch. Neurology saw the patient consultation.  3. Lewy body dementia:  4. TIA/CVA: CTA of the head and neck showed no acute pathology. CVA workup pending.      Management plans discussed with the patient's wifeand she is in agreement.  CODE STATUS: FULL  TOTAL TIME TAKING CARE OF THIS PATIENT: 30 minutes.     POSSIBLE D/C today or tomorrow, DEPENDING ON CLINICAL CONDITION.   Haitham Dolinsky M.D on 03/07/2015 at 11:01 AM  Between 7am to 6pm - Pager - [phone] After 6pm go to [email or web address] - password [email or web address] [facility]  Office  [phone]  CC: Primary care physician; Loistine Chance, MD  Note: This dictation was prepared with Dragon dictation along with smaller phrase technology. Any transcriptional errors that result from this process are unintentional.

## 2015-03-07 NOTE — Care Management (Addendum)
Admitted to [facility] with the diagnosis of TIA. Lives with wife, [name] [phone] or [phone]). Last seen Dr. Joeseph Amor 1 year ago. Seen Dr. Carles Collet about a month ago. Followed by [facility] for speech 3 x week and physical therapy 2 x week. No skilled facility. No home oxygen. No Life Alert. Wife is with him 24/7. Uses a rolling walker and transport wheelchair to aid in ambulation. [facility] in the home about a week ago. Good appetite. Shane Mooney feeds, dresses, and showers himself. The wife takes care of all instrumental needs and drives. Shane Mooney stays in the bed 50% of the time. Wife will transport. Shelbie Ammons RN MSN CCM Care Management [phone]

## 2015-03-07 NOTE — Care Management Obs Status (Signed)
[place] NOTIFICATION   Patient Details  Name: Shane Mooney. MRN: [id number] Date of Birth: 12/11/1940   Medicare Observation Status Notification Given:  Yes    Shelbie Ammons, RN 03/07/2015, 10:34 AM

## 2015-03-07 NOTE — Progress Notes (Signed)
PT Cancellation Note  Patient Details Name: Shane Mooney. MRN: [id number] DOB: 1941-02-18   Cancelled Treatment:    Reason Eval/Treat Not Completed: Patient at procedure or test/unavailable (Evaluation attempted; patient currently leaving unit for ECHO.  Will re-attempt at later time as patient medically appropriate and available.)  Placida Cambre H. Owens Shark, PT, DPT, NCS 03/07/2015, 9:14 AM [phone]

## 2015-03-07 NOTE — Discharge Summary (Signed)
[facility] NAME: Shane Mooney    MR#:  [id number]  DATE OF BIRTH:  17-Jun-1940  DATE OF ADMISSION:  03/06/2015 ADMITTING PHYSICIAN: Hillary Bow, MD  DATE OF DISCHARGE: 03/07/2015   PRIMARY CARE PHYSICIAN: Loistine Chance, MD    ADMISSION DIAGNOSIS:  TIA (transient ischemic attack) [G45.9] CVA (cerebral infarction) [I63.9] Cerebral infarction due to unspecified mechanism [I63.9]  DISCHARGE DIAGNOSIS:  Active Problems:   Orthostatic hypotension   SECONDARY DIAGNOSIS:   Past Medical History  Diagnosis Date  . Ulcer   . Hernia     LIH  . 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 Cancer ([place]) 2013    basal cell carcinoma back and face  . Personal history of colonic polyps   . Parkinson disease [facility]) April 2014  . Dementia     HOSPITAL COURSE:   74 year old male with known history of Parkinson's disease and dementia who presented with orthostatic hypotension. He was found to have an abnormality and head CT scan. For further details please further H&P.  1. Orthostatic hypotension: This is due to his Parkinson's disease and medications. There may be a slight component of dehydration. Blood pressure has improved he remains orthostatic especially from sitting to standing. Patient's wife was given orthostatic hypotension instructions.  2. Parkinson disease: Patient will continue Sinemet and Exelon patch. Wife acknowledges that his medications and Parkinson's is causing orthostasis, but needs these medications. He will need to follow up with his Neurologist in the next 1-2 weeks. PT Recommends HHC with PT.  3. Lewy body dementia: Patient with progressively worsening dementia.  4. TIA: CTA of the head and neck showed no acute pathology. MRI did not show acute CVA. ECHO shows normal EF with grade 1 diastolic heart failure. He will be discharged on ASA and statin.  DISCHARGE CONDITIONS AND DIET:    Stable  for discharge home on a regular diet  CONSULTS OBTAINED:     DRUG ALLERGIES:  No Known Allergies  DISCHARGE MEDICATIONS:   Current Discharge Medication List    START taking these medications   Details  aspirin EC 81 MG EC tablet Take 1 tablet (81 mg total) by mouth daily. Qty: 120 tablet, Refills: 0    atorvastatin (LIPITOR) 10 MG tablet Take 1 tablet (10 mg total) by mouth daily. Qty: 30 tablet, Refills: 0      CONTINUE these medications which have NOT CHANGED   Details  carbidopa-levodopa (SINEMET IR) 25-100 MG tablet Take 0.5 tablets by mouth 3 (three) times daily. Qty: 45 tablet, Refills: 5    Cholecalciferol (VITAMIN D PO) Take 2,000 Units by mouth daily.    Cyanocobalamin (VITAMIN B-12 PO) Take 1,000 mcg by mouth daily.    Melatonin 3 MG CAPS Take by mouth at bedtime.    QUEtiapine (SEROQUEL) 25 MG tablet 1 tablet in the morning, 1/2 tablet in the afternoon, 1 at night. Patient can take an extra 1/2 tablet daily as needed Qty: 90 tablet, Refills: 5    rivastigmine (EXELON) 9.5 mg/24hr Place 1 patch (9.5 mg total) onto the skin daily. Qty: 30 patch, Refills: 5              Today   CHIEF COMPLAINT:  Wife at bedside. Patient has dementia   VITAL SIGNS:  Blood pressure 155/75, pulse 77, temperature 98.1 F (36.7 C), temperature source Oral, resp. rate 18, height 5\' 10"  (1.778 m), weight 73.165 kg (161 lb  4.8 oz), SpO2 97 %.   REVIEW OF SYSTEMS:  Review of Systems  Unable to perform ROS: dementia     PHYSICAL EXAMINATION:  GENERAL:  74 y.o.-year-old patient lying in the bed with no acute distress.  NECK:  Supple, no jugular venous distention. No thyroid enlargement, no tenderness.  LUNGS: Normal breath sounds bilaterally, no wheezing, rales,rhonchi  No use of accessory muscles of respiration.  CARDIOVASCULAR: S1, S2 normal. No murmurs, rubs, or gallops.  ABDOMEN: Soft, non-tender, non-distended. Bowel sounds present. No organomegaly or mass.   EXTREMITIES: No pedal edema, cyanosis, or clubbing.  PSYCHIATRIC: The patient is alert and disoriented  SKIN: No obvious rash, lesion, or ulcer.  Neuro: Slight left facial droop DATA REVIEW:   CBC  Recent Labs Lab 03/07/15 0456  WBC 14.9*  HGB 13.8  HCT 39.7*  PLT 242    Chemistries   Recent Labs Lab 03/07/15 0456  NA 142  K 3.6  CL 107  CO2 28  GLUCOSE 117*  BUN 19  CREATININE 1.26*  CALCIUM 9.2    Cardiac Enzymes  Recent Labs Lab 03/06/15 1158  [place] <0.03    Microbiology Results  @[email or web address] @  RADIOLOGY:  Ct Angio Head W/cm &/or Wo Cm  03/06/2015  CLINICAL DATA:  Hypotensive episode. Patient was wobbly upon standing. Lethargy and confusion upon arrival in the ED. Mental status and vital signs now normalized. EXAM: CT ANGIOGRAPHY HEAD AND NECK TECHNIQUE: Multidetector CT imaging of the head and neck was performed using the standard protocol during bolus administration of intravenous contrast. Multiplanar CT image reconstructions and MIPs were obtained to evaluate the vascular anatomy. Carotid stenosis measurements (when applicable) are obtained utilizing NASCET criteria, using the distal internal carotid diameter as the denominator. CONTRAST:  136mL OMNIPAQUE IOHEXOL 350 MG/ML SOLN COMPARISON:  Noncontrast CT head earlier today. MRI head 08/07/2014. FINDINGS: CT HEAD Calvarium and skull base: No fracture or destructive lesion. Mastoids and middle ears are grossly clear. Paranasal sinuses: Imaged portions are clear. Orbits: Negative. Brain: No evidence of acute abnormality, including acute infarct, hemorrhage, hydrocephalus, or mass lesion. Atrophy with chronic microvascular change. CTA NECK Aortic arch: Variant branching anatomy with LEFT vertebral arising directly from the arch. Transverse arch atheromatous calcification and mural great vessel calcification. Imaged portion shows no evidence of aneurysm or dissection. No significant stenosis of the major arch  vessel origins. Right carotid system: Non stenotic calcific atheromatous change at the bifurcation. No evidence of dissection, stenosis (50% or greater) or occlusion. Left carotid system: Non stenotic calcific atheromatous change at the bifurcation. No evidence of dissection, stenosis (50% or greater) or occlusion. Vertebral arteries: LEFT dominant. No evidence of dissection, stenosis (50% or greater) or occlusion. Nonvascular soft tissues: Lung apices clear. No masses. Airway midline. 6 x 9 mm RIGHT thyroid cyst, non worrisome. Cervical spondylosis. No worrisome osseous lesion. CTA HEAD Anterior circulation: Non stenotic atheromatous calcification in the carotid siphons. No significant stenosis, proximal occlusion, aneurysm, or vascular malformation. With regard to the possible dense LEFT MCA, no large vessel occlusion or anterior circulation stenosis. Posterior circulation: Eccentric non stenotic calcific plaque both intracranial vertebral arteries. No significant stenosis, proximal occlusion, aneurysm, or vascular malformation. Venous sinuses: As permitted by contrast timing, patent. Anatomic variants: None of significance. Delayed phase:   No abnormal intracranial enhancement. IMPRESSION: No intracranial large vessel occlusion. No abnormal postcontrast enhancement or evidence for acute cerebral infarction. Calcific non stenotic atheromatous change at the carotid bifurcations, both carotid siphons, and both distal vertebral arteries, without observable  flow reducing lesion. Electronically Signed   By: Staci Righter M.D.   On: 03/06/2015 16:23   Dg Chest 2 View  03/06/2015  CLINICAL DATA:  Lethargy and confusion today. Possible hypotension. Initial encounter. EXAM: CHEST  2 VIEW COMPARISON:  None. FINDINGS: The lungs are clear. Heart size is normal. There is no pneumothorax or pleural effusion. No focal bony abnormality. IMPRESSION: No acute disease. Electronically Signed   By: Inge Rise M.D.   On:  03/06/2015 14:12   Ct Head Wo Contrast  03/06/2015  CLINICAL DATA:  Hypotensive episode with lethargy EXAM: CT HEAD WITHOUT CONTRAST TECHNIQUE: Contiguous axial images were obtained from the base of the skull through the vertex without intravenous contrast. COMPARISON:  08/07/14 FINDINGS: The bony calvarium is intact. No findings to suggest acute hemorrhage, acute infarction or space-occupying mass lesion are noted. Mild atrophic changes are noted commenced with the patient's given age. Some slight increased density is noted in the distribution of the left middle cerebral artery. This is of uncertain significance but may be related to acute thrombosis. Correlation with clinical symptomatology is recommended. The need for MRI can be determined on a clinical basis. IMPRESSION: Atrophic changes. Slight increased density in the left middle cerebral artery. This could represent acute thrombosis. Clinical correlation is recommended. Electronically Signed   By: Inez Catalina M.D.   On: 03/06/2015 13:48   Ct Angio Neck W/cm &/or Wo/cm  03/06/2015  CLINICAL DATA:  Hypotensive episode. Patient was wobbly upon standing. Lethargy and confusion upon arrival in the ED. Mental status and vital signs now normalized. EXAM: CT ANGIOGRAPHY HEAD AND NECK TECHNIQUE: Multidetector CT imaging of the head and neck was performed using the standard protocol during bolus administration of intravenous contrast. Multiplanar CT image reconstructions and MIPs were obtained to evaluate the vascular anatomy. Carotid stenosis measurements (when applicable) are obtained utilizing NASCET criteria, using the distal internal carotid diameter as the denominator. CONTRAST:  134mL OMNIPAQUE IOHEXOL 350 MG/ML SOLN COMPARISON:  Noncontrast CT head earlier today. MRI head 08/07/2014. FINDINGS: CT HEAD Calvarium and skull base: No fracture or destructive lesion. Mastoids and middle ears are grossly clear. Paranasal sinuses: Imaged portions are clear.  Orbits: Negative. Brain: No evidence of acute abnormality, including acute infarct, hemorrhage, hydrocephalus, or mass lesion. Atrophy with chronic microvascular change. CTA NECK Aortic arch: Variant branching anatomy with LEFT vertebral arising directly from the arch. Transverse arch atheromatous calcification and mural great vessel calcification. Imaged portion shows no evidence of aneurysm or dissection. No significant stenosis of the major arch vessel origins. Right carotid system: Non stenotic calcific atheromatous change at the bifurcation. No evidence of dissection, stenosis (50% or greater) or occlusion. Left carotid system: Non stenotic calcific atheromatous change at the bifurcation. No evidence of dissection, stenosis (50% or greater) or occlusion. Vertebral arteries: LEFT dominant. No evidence of dissection, stenosis (50% or greater) or occlusion. Nonvascular soft tissues: Lung apices clear. No masses. Airway midline. 6 x 9 mm RIGHT thyroid cyst, non worrisome. Cervical spondylosis. No worrisome osseous lesion. CTA HEAD Anterior circulation: Non stenotic atheromatous calcification in the carotid siphons. No significant stenosis, proximal occlusion, aneurysm, or vascular malformation. With regard to the possible dense LEFT MCA, no large vessel occlusion or anterior circulation stenosis. Posterior circulation: Eccentric non stenotic calcific plaque both intracranial vertebral arteries. No significant stenosis, proximal occlusion, aneurysm, or vascular malformation. Venous sinuses: As permitted by contrast timing, patent. Anatomic variants: None of significance. Delayed phase:   No abnormal intracranial enhancement. IMPRESSION: No intracranial  large vessel occlusion. No abnormal postcontrast enhancement or evidence for acute cerebral infarction. Calcific non stenotic atheromatous change at the carotid bifurcations, both carotid siphons, and both distal vertebral arteries, without observable flow reducing  lesion. Electronically Signed   By: Staci Righter M.D.   On: 03/06/2015 16:23      Management plans discussed with the patient's wife and sheis in agreement. Stable for discharge   Patient should follow up with PCP in 1 week and neurologist in 1 week  CODE STATUS:     Code Status Orders        Start     Ordered   03/06/15 1630  Full code   Continuous     03/06/15 1631    Advance Directive Documentation        Most Recent Value   Type of Advance Directive  Healthcare Power of Attorney, Living will   Pre-existing out of facility DNR order (yellow form or pink MOST form)     "MOST" Form in Place?        TOTAL TIME TAKING CARE OF THIS PATIENT: 35 minutes.    Note: This dictation was prepared with Dragon dictation along with smaller phrase technology. Any transcriptional errors that result from this process are unintentional.  Keali Mccraw M.D on 03/07/2015 at 10:55 AM  Between 7am to 6pm - Pager - [phone] After 6pm go to [email or web address] - password [email or web address] [facility]  Office  [phone]  CC: Primary care physician; Loistine Chance, MD

## 2015-03-07 NOTE — Discharge Summary (Signed)
[facility] [place] NAME: Shane Mooney    MR#:  [id number]  DATE OF BIRTH:  1940-09-17  DATE OF ADMISSION:  03/06/2015 ADMITTING PHYSICIAN: Hillary Bow, MD  DATE OF DISCHARGE: 03/07/2015   PRIMARY CARE PHYSICIAN: Loistine Chance, MD    ADMISSION DIAGNOSIS:  TIA (transient ischemic attack) [G45.9] CVA (cerebral infarction) [I63.9] Cerebral infarction due to unspecified mechanism [I63.9]  DISCHARGE DIAGNOSIS:  Active Problems:   Orthostatic hypotension TIA  SECONDARY DIAGNOSIS:   Past Medical History  Diagnosis Date  . Ulcer   . Hernia     LIH  . 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 Cancer ([place]) 2013    basal cell carcinoma back and face  . Personal history of colonic polyps   . Parkinson disease [facility]) April 2014  . Dementia     HOSPITAL COURSE:   74 year old male with known history of Parkinson's disease and dementia who presented with orthostatic hypotension. He was found to have an abnormality and head CT scan. For further details please further H&P.  1. Orthostatic hypotension: This is due to his Parkinson's disease and medications. There may be a slight component of dehydration. Blood pressure has improved he remains orthostatic especially from sitting to standing. Patient's wife was given orthostatic hypotension instructions.  2. Parkinson disease: Patient will continue Sinemet and Exelon patch. Neurology saw the patient consultation.  3. Lewy body dementia:  4. TIA: Likely microvascular in nature likely due to HTN. MRI negative for acute CVA. Continue ASA and statin.   DISCHARGE CONDITIONS AND DIET:    Stable for discharge home on a regular diet  CONSULTS OBTAINED:     DRUG ALLERGIES:  No Known Allergies  DISCHARGE MEDICATIONS:   Current Discharge Medication List    START taking these medications   Details  aspirin EC 81 MG EC tablet Take 1 tablet (81 mg total) by mouth  daily. Qty: 120 tablet, Refills: 0    atorvastatin (LIPITOR) 10 MG tablet Take 1 tablet (10 mg total) by mouth daily. Qty: 30 tablet, Refills: 0      CONTINUE these medications which have NOT CHANGED   Details  carbidopa-levodopa (SINEMET IR) 25-100 MG tablet Take 0.5 tablets by mouth 3 (three) times daily. Qty: 45 tablet, Refills: 5    Cholecalciferol (VITAMIN D PO) Take 2,000 Units by mouth daily.    Cyanocobalamin (VITAMIN B-12 PO) Take 1,000 mcg by mouth daily.    Melatonin 3 MG CAPS Take by mouth at bedtime.    QUEtiapine (SEROQUEL) 25 MG tablet 1 tablet in the morning, 1/2 tablet in the afternoon, 1 at night. Patient can take an extra 1/2 tablet daily as needed Qty: 90 tablet, Refills: 5    rivastigmine (EXELON) 9.5 mg/24hr Place 1 patch (9.5 mg total) onto the skin daily. Qty: 30 patch, Refills: 5              Today   CHIEF COMPLAINT:  Wife at bedside. Patient has dementia   VITAL SIGNS:  Blood pressure 155/75, pulse 77, temperature 98.1 F (36.7 C), temperature source Oral, resp. rate 18, height 5\' 10"  (1.778 m), weight 73.165 kg (161 lb 4.8 oz), SpO2 97 %.   REVIEW OF SYSTEMS:  Review of Systems  Unable to perform ROS: dementia     PHYSICAL EXAMINATION:  GENERAL:  74 y.o.-year-old patient lying in the bed with no acute distress.  NECK:  Supple, no jugular venous distention. No  thyroid enlargement, no tenderness.  LUNGS: Normal breath sounds bilaterally, no wheezing, rales,rhonchi  No use of accessory muscles of respiration.  CARDIOVASCULAR: S1, S2 normal. No murmurs, rubs, or gallops.  ABDOMEN: Soft, non-tender, non-distended. Bowel sounds present. No organomegaly or mass.  EXTREMITIES: No pedal edema, cyanosis, or clubbing.  PSYCHIATRIC: The patient is alert and disoriented  SKIN: No obvious rash, lesion, or ulcer.  Neuro: Slight left facial droop DATA REVIEW:   CBC  Recent Labs Lab 03/07/15 0456  WBC 14.9*  HGB 13.8  HCT 39.7*  PLT 242     Chemistries   Recent Labs Lab 03/07/15 0456  NA 142  K 3.6  CL 107  CO2 28  GLUCOSE 117*  BUN 19  CREATININE 1.26*  CALCIUM 9.2    Cardiac Enzymes  Recent Labs Lab 03/06/15 1158  Dane <0.03    Microbiology Results  @MICRORSLT48 @  RADIOLOGY:  Ct Angio Head W/cm &/or Wo Cm  03/06/2015  CLINICAL DATA:  Hypotensive episode. Patient was wobbly upon standing. Lethargy and confusion upon arrival in the ED. Mental status and vital signs now normalized. EXAM: CT ANGIOGRAPHY HEAD AND NECK TECHNIQUE: Multidetector CT imaging of the head and neck was performed using the standard protocol during bolus administration of intravenous contrast. Multiplanar CT image reconstructions and MIPs were obtained to evaluate the vascular anatomy. Carotid stenosis measurements (when applicable) are obtained utilizing NASCET criteria, using the distal internal carotid diameter as the denominator. CONTRAST:  167mL OMNIPAQUE IOHEXOL 350 MG/ML SOLN COMPARISON:  Noncontrast CT head earlier today. MRI head 08/07/2014. FINDINGS: CT HEAD Calvarium and skull base: No fracture or destructive lesion. Mastoids and middle ears are grossly clear. Paranasal sinuses: Imaged portions are clear. Orbits: Negative. Brain: No evidence of acute abnormality, including acute infarct, hemorrhage, hydrocephalus, or mass lesion. Atrophy with chronic microvascular change. CTA NECK Aortic arch: Variant branching anatomy with LEFT vertebral arising directly from the arch. Transverse arch atheromatous calcification and mural great vessel calcification. Imaged portion shows no evidence of aneurysm or dissection. No significant stenosis of the major arch vessel origins. Right carotid system: Non stenotic calcific atheromatous change at the bifurcation. No evidence of dissection, stenosis (50% or greater) or occlusion. Left carotid system: Non stenotic calcific atheromatous change at the bifurcation. No evidence of dissection, stenosis  (50% or greater) or occlusion. Vertebral arteries: LEFT dominant. No evidence of dissection, stenosis (50% or greater) or occlusion. Nonvascular soft tissues: Lung apices clear. No masses. Airway midline. 6 x 9 mm RIGHT thyroid cyst, non worrisome. Cervical spondylosis. No worrisome osseous lesion. CTA HEAD Anterior circulation: Non stenotic atheromatous calcification in the carotid siphons. No significant stenosis, proximal occlusion, aneurysm, or vascular malformation. With regard to the possible dense LEFT MCA, no large vessel occlusion or anterior circulation stenosis. Posterior circulation: Eccentric non stenotic calcific plaque both intracranial vertebral arteries. No significant stenosis, proximal occlusion, aneurysm, or vascular malformation. Venous sinuses: As permitted by contrast timing, patent. Anatomic variants: None of significance. Delayed phase:   No abnormal intracranial enhancement. IMPRESSION: No intracranial large vessel occlusion. No abnormal postcontrast enhancement or evidence for acute cerebral infarction. Calcific non stenotic atheromatous change at the carotid bifurcations, both carotid siphons, and both distal vertebral arteries, without observable flow reducing lesion. Electronically Signed   By: Staci Righter M.D.   On: 03/06/2015 16:23   Dg Chest 2 View  03/06/2015  CLINICAL DATA:  Lethargy and confusion today. Possible hypotension. Initial encounter. EXAM: CHEST  2 VIEW COMPARISON:  None. FINDINGS: The lungs are  clear. Heart size is normal. There is no pneumothorax or pleural effusion. No focal bony abnormality. IMPRESSION: No acute disease. Electronically Signed   By: Inge Rise M.D.   On: 03/06/2015 14:12   Ct Head Wo Contrast  03/06/2015  CLINICAL DATA:  Hypotensive episode with lethargy EXAM: CT HEAD WITHOUT CONTRAST TECHNIQUE: Contiguous axial images were obtained from the base of the skull through the vertex without intravenous contrast. COMPARISON:  08/07/14  FINDINGS: The bony calvarium is intact. No findings to suggest acute hemorrhage, acute infarction or space-occupying mass lesion are noted. Mild atrophic changes are noted commenced with the patient's given age. Some slight increased density is noted in the distribution of the left middle cerebral artery. This is of uncertain significance but may be related to acute thrombosis. Correlation with clinical symptomatology is recommended. The need for MRI can be determined on a clinical basis. IMPRESSION: Atrophic changes. Slight increased density in the left middle cerebral artery. This could represent acute thrombosis. Clinical correlation is recommended. Electronically Signed   By: Inez Catalina M.D.   On: 03/06/2015 13:48   Ct Angio Neck W/cm &/or Wo/cm  03/06/2015  CLINICAL DATA:  Hypotensive episode. Patient was wobbly upon standing. Lethargy and confusion upon arrival in the ED. Mental status and vital signs now normalized. EXAM: CT ANGIOGRAPHY HEAD AND NECK TECHNIQUE: Multidetector CT imaging of the head and neck was performed using the standard protocol during bolus administration of intravenous contrast. Multiplanar CT image reconstructions and MIPs were obtained to evaluate the vascular anatomy. Carotid stenosis measurements (when applicable) are obtained utilizing NASCET criteria, using the distal internal carotid diameter as the denominator. CONTRAST:  128mL OMNIPAQUE IOHEXOL 350 MG/ML SOLN COMPARISON:  Noncontrast CT head earlier today. MRI head 08/07/2014. FINDINGS: CT HEAD Calvarium and skull base: No fracture or destructive lesion. Mastoids and middle ears are grossly clear. Paranasal sinuses: Imaged portions are clear. Orbits: Negative. Brain: No evidence of acute abnormality, including acute infarct, hemorrhage, hydrocephalus, or mass lesion. Atrophy with chronic microvascular change. CTA NECK Aortic arch: Variant branching anatomy with LEFT vertebral arising directly from the arch. Transverse arch  atheromatous calcification and mural great vessel calcification. Imaged portion shows no evidence of aneurysm or dissection. No significant stenosis of the major arch vessel origins. Right carotid system: Non stenotic calcific atheromatous change at the bifurcation. No evidence of dissection, stenosis (50% or greater) or occlusion. Left carotid system: Non stenotic calcific atheromatous change at the bifurcation. No evidence of dissection, stenosis (50% or greater) or occlusion. Vertebral arteries: LEFT dominant. No evidence of dissection, stenosis (50% or greater) or occlusion. Nonvascular soft tissues: Lung apices clear. No masses. Airway midline. 6 x 9 mm RIGHT thyroid cyst, non worrisome. Cervical spondylosis. No worrisome osseous lesion. CTA HEAD Anterior circulation: Non stenotic atheromatous calcification in the carotid siphons. No significant stenosis, proximal occlusion, aneurysm, or vascular malformation. With regard to the possible dense LEFT MCA, no large vessel occlusion or anterior circulation stenosis. Posterior circulation: Eccentric non stenotic calcific plaque both intracranial vertebral arteries. No significant stenosis, proximal occlusion, aneurysm, or vascular malformation. Venous sinuses: As permitted by contrast timing, patent. Anatomic variants: None of significance. Delayed phase:   No abnormal intracranial enhancement. IMPRESSION: No intracranial large vessel occlusion. No abnormal postcontrast enhancement or evidence for acute cerebral infarction. Calcific non stenotic atheromatous change at the carotid bifurcations, both carotid siphons, and both distal vertebral arteries, without observable flow reducing lesion. Electronically Signed   By: Staci Righter M.D.   On: 03/06/2015 16:23  Mr Brain Wo Contrast  03/07/2015  CLINICAL DATA:  TIA. Hypotension, dizziness, and lightheaded yesterday. Left-sided facial droop. History of dementia and Parkinson's disease. EXAM: MRI HEAD WITHOUT  CONTRAST TECHNIQUE: Multiplanar, multiecho pulse sequences of the brain and surrounding structures were obtained without intravenous contrast. COMPARISON:  Head CT and CTA 03/06/2015.  Head MRI 08/07/2014. FINDINGS: There is no evidence of acute infarct, intracranial hemorrhage, mass, midline shift, or extra-axial fluid collection. Mild to moderate cerebral atrophy is unchanged. T2 hyperintensities in the subcortical and deep cerebral white matter and in the pons have not significantly changed from the prior MRI and are compatible with mild chronic small vessel ischemic disease. Orbits are unremarkable. Paranasal sinuses and mastoid air cells are clear. Major intracranial vascular flow voids are preserved. IMPRESSION: 1. No acute intracranial abnormality. 2. 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 Electronically Signed   By: Logan Bores M.D.   On: 03/07/2015 11:38      Management plans discussed with the patient's wife and she is in agreement. Stable for discharge   Patient should follow up with PCP in 1 week and neurologist in 1 week  CODE STATUS:     Code Status Orders        Start     Ordered   03/06/15 1630  Full code   Continuous     03/06/15 1631    Advance Directive Documentation        Most Recent Value   Type of Advance Directive  Healthcare Power of Attorney, Living will   Pre-existing out of facility DNR order (yellow form or pink MOST form)     "MOST" Form in Place?        TOTAL TIME TAKING CARE OF THIS PATIENT: 35 minutes.    Note: This dictation was prepared with Dragon dictation along with smaller phrase technology. Any transcriptional errors that result from this process are unintentional.  Glendine Swetz M.D on 03/07/2015 at 11:49 AM  Between 7am to 6pm - Pager - [phone] After 6pm go to [email or web address] - password [email or web address] [facility]  Office  [phone]  CC: Primary care physician; Loistine Chance, MD

## 2015-03-07 NOTE — Progress Notes (Signed)
PT Cancellation Note  Patient Details Name: Shane Mooney. MRN: [id number] DOB: 1941-02-21   Cancelled Treatment:    Reason Eval/Treat Not Completed: Fatigue/lethargy limiting ability to participate (Evaluation re-attempted.  Per family member at bedside, patient received ativan for testing this AM and has been sleeping/lethargic since.  Unable to participate with skilled PT eval at this time.  Will re-attempt at later time/date as appropriate.)   Andra Heslin H. Owens Shark, PT, DPT, NCS 03/07/2015, 2:59 PM [phone]

## 2015-03-07 NOTE — Progress Notes (Signed)
Speech Therapy Note: received order, reviewed chart notes and met w/ wife and pt in room. Thoroughly discussed pt's swallowing in regard to his increased risk for dysphagia sec. to the Parkinson's Dis. Both wife and pt denied any dysphagia and stated he ate and drank during the breakfast meal w/ no difficulty - no coughing or throat clearing noted. He ate a full breakfast meal as well. Pt's speech is intelligible w/ low volume; pt is receiving ST services via [facility] for "voice therapy" per wife 3x week. Pt was leaving for his test. Wife denied any need for a swallow evaluation at this time. Thoroughly discussed aspiration precautions w/ wife. No further ST needs at this time; NSG to reconsult if any change in status while admitted.

## 2015-03-07 NOTE — Progress Notes (Signed)
*  PRELIMINARY RESULTS* Echocardiogram 2D Echocardiogram has been performed.  Laqueta Jean Hege 03/07/2015, 9:38 AM

## 2015-03-08 ENCOUNTER — Telehealth: Payer: Self-pay | Admitting: Neurology

## 2015-03-08 NOTE — Telephone Encounter (Signed)
Left message on machine for patient to call back.

## 2015-03-08 NOTE — Telephone Encounter (Signed)
VM-Pt left message in regards to his hospital stay and would like a call back at [phone]/Dawn

## 2015-03-09 ENCOUNTER — Telehealth: Payer: Self-pay

## 2015-03-09 DIAGNOSIS — I5189 Other ill-defined heart diseases: Secondary | ICD-10-CM

## 2015-03-09 NOTE — Telephone Encounter (Signed)
Spoke with Manuela Schwartz and patient was at [facility] for a few days. They told him he did not have a stroke and could not find a reason for his low blood pressure. He was checked out by cardiology in the hospital but everything checked out okay. They did put him on Lipitor and Aspirin. They have a follow up with PCP in a week. I advised to talk to PCP about cardiology referral to manage low blood pressure. They will call with any other concerns. Made her aware I would pass message along to Dr Tat and she can see records from [place].

## 2015-03-09 NOTE — Telephone Encounter (Signed)
Left message on machine for Shane Mooney to call back.

## 2015-03-09 NOTE — Telephone Encounter (Signed)
See if pt can come in on Tuesday AM and do orthostatics

## 2015-03-09 NOTE — Telephone Encounter (Signed)
Patient states doing well started him on a 81 mg aspirin and is going to schedule appointment for 2 weeks

## 2015-03-09 NOTE — Telephone Encounter (Signed)
Left message for Shane Mooney to call and speak with the front desk about scheduling an appt for Tuesday morning, or another time if this doesn't work for them, so we can check his blood pressure and see him in the office.

## 2015-03-10 ENCOUNTER — Emergency Department: Payer: Medicare Other

## 2015-03-10 ENCOUNTER — Inpatient Hospital Stay
Admission: EM | Admit: 2015-03-10 | Discharge: 2015-03-13 | DRG: 683 | Disposition: A | Discharge status: Skilled Nursing Facility | Payer: Medicare Other | Attending: Internal Medicine | Admitting: Internal Medicine

## 2015-03-10 ENCOUNTER — Encounter: Payer: Self-pay | Admitting: Emergency Medicine

## 2015-03-10 DIAGNOSIS — Z87891 Personal history of nicotine dependence: Secondary | ICD-10-CM | POA: Diagnosis not present

## 2015-03-10 DIAGNOSIS — N390 Urinary tract infection, site not specified: Secondary | ICD-10-CM

## 2015-03-10 DIAGNOSIS — Z66 Do not resuscitate: Secondary | ICD-10-CM | POA: Diagnosis present

## 2015-03-10 DIAGNOSIS — Z7982 Long term (current) use of aspirin: Secondary | ICD-10-CM

## 2015-03-10 DIAGNOSIS — Z85828 Personal history of other malignant neoplasm of skin: Secondary | ICD-10-CM | POA: Diagnosis not present

## 2015-03-10 DIAGNOSIS — Z79899 Other long term (current) drug therapy: Secondary | ICD-10-CM | POA: Diagnosis not present

## 2015-03-10 DIAGNOSIS — Z86010 Personal history of colonic polyps: Secondary | ICD-10-CM | POA: Diagnosis not present

## 2015-03-10 DIAGNOSIS — R338 Other retention of urine: Secondary | ICD-10-CM | POA: Diagnosis present

## 2015-03-10 DIAGNOSIS — N320 Bladder-neck obstruction: Secondary | ICD-10-CM | POA: Diagnosis present

## 2015-03-10 DIAGNOSIS — F0280 Dementia in other diseases classified elsewhere without behavioral disturbance: Secondary | ICD-10-CM | POA: Diagnosis present

## 2015-03-10 DIAGNOSIS — N1339 Other hydronephrosis: Secondary | ICD-10-CM | POA: Diagnosis present

## 2015-03-10 DIAGNOSIS — N401 Enlarged prostate with lower urinary tract symptoms: Secondary | ICD-10-CM | POA: Diagnosis present

## 2015-03-10 DIAGNOSIS — R319 Hematuria, unspecified: Secondary | ICD-10-CM

## 2015-03-10 DIAGNOSIS — G20 Parkinson's disease: Secondary | ICD-10-CM | POA: Diagnosis present

## 2015-03-10 DIAGNOSIS — R390 Extravasation of urine: Secondary | ICD-10-CM | POA: Diagnosis present

## 2015-03-10 DIAGNOSIS — R339 Retention of urine, unspecified: Secondary | ICD-10-CM | POA: Diagnosis not present

## 2015-03-10 DIAGNOSIS — R109 Unspecified abdominal pain: Secondary | ICD-10-CM | POA: Diagnosis present

## 2015-03-10 DIAGNOSIS — E785 Hyperlipidemia, unspecified: Secondary | ICD-10-CM | POA: Diagnosis present

## 2015-03-10 DIAGNOSIS — I10 Essential (primary) hypertension: Secondary | ICD-10-CM | POA: Diagnosis present

## 2015-03-10 DIAGNOSIS — N178 Other acute kidney failure: Secondary | ICD-10-CM | POA: Diagnosis not present

## 2015-03-10 DIAGNOSIS — G3183 Dementia with Lewy bodies: Secondary | ICD-10-CM | POA: Diagnosis present

## 2015-03-10 DIAGNOSIS — N179 Acute kidney failure, unspecified: Secondary | ICD-10-CM | POA: Diagnosis not present

## 2015-03-10 DIAGNOSIS — R4182 Altered mental status, unspecified: Secondary | ICD-10-CM

## 2015-03-10 LAB — CBC WITH DIFFERENTIAL/PLATELET
BASOS ABS: 0 10*3/uL (ref 0–0.1)
BASOS PCT: 0 %
Eosinophils Absolute: 0.6 10*3/uL (ref 0–0.7)
Eosinophils Relative: 5 %
HEMATOCRIT: 37 % — AB (ref 40.0–52.0)
HEMOGLOBIN: 12.5 g/dL — AB (ref 13.0–18.0)
LYMPHS PCT: 12 %
Lymphs Abs: 1.5 10*3/uL (ref 1.0–3.6)
MCH: 31.4 pg (ref 26.0–34.0)
MCHC: 33.9 g/dL (ref 32.0–36.0)
MCV: 92.7 fL (ref 80.0–100.0)
Monocytes Absolute: 1.3 10*3/uL — ABNORMAL HIGH (ref 0.2–1.0)
Monocytes Relative: 10 %
NEUTROS ABS: 9.3 10*3/uL — AB (ref 1.4–6.5)
NEUTROS PCT: 73 %
Platelets: 223 10*3/uL (ref 150–440)
RBC: 3.99 MIL/uL — AB (ref 4.40–5.90)
RDW: 14.2 % (ref 11.5–14.5)
WBC: 12.8 10*3/uL — AB (ref 3.8–10.6)

## 2015-03-10 LAB — URINALYSIS COMPLETE WITH MICROSCOPIC (ARMC ONLY)
BACTERIA UA: NONE SEEN
Bilirubin Urine: NEGATIVE
GLUCOSE, UA: NEGATIVE mg/dL
Ketones, ur: NEGATIVE mg/dL
LEUKOCYTES UA: NEGATIVE
Nitrite: NEGATIVE
PH: 5 (ref 5.0–8.0)
Protein, ur: NEGATIVE mg/dL
SPECIFIC GRAVITY, URINE: 1.024 (ref 1.005–1.030)

## 2015-03-10 LAB — COMPREHENSIVE METABOLIC PANEL
ALK PHOS: 54 U/L (ref 38–126)
ALT: 6 U/L — ABNORMAL LOW (ref 17–63)
ANION GAP: 8 (ref 5–15)
AST: 14 U/L — AB (ref 15–41)
Albumin: 3.6 g/dL (ref 3.5–5.0)
BILIRUBIN TOTAL: 3.8 mg/dL — AB (ref 0.3–1.2)
BUN: 50 mg/dL — AB (ref 6–20)
CO2: 26 mmol/L (ref 22–32)
Calcium: 9.4 mg/dL (ref 8.9–10.3)
Chloride: 102 mmol/L (ref 101–111)
Creatinine, Ser: 5.35 mg/dL — ABNORMAL HIGH (ref 0.61–1.24)
GFR calc Af Amer: 11 mL/min — ABNORMAL LOW (ref >60)
GFR, EST NON AFRICAN AMERICAN: 10 mL/min — AB (ref >60)
GLUCOSE: 107 mg/dL — AB (ref 65–99)
POTASSIUM: 3.9 mmol/L (ref 3.5–5.1)
Sodium: 136 mmol/L (ref 135–145)
Total Protein: 7.2 g/dL (ref 6.5–8.1)

## 2015-03-10 LAB — LACTIC ACID, PLASMA
LACTIC ACID, VENOUS: 1.2 mmol/L (ref 0.5–2.0)
LACTIC ACID, VENOUS: 0.7 mmol/L (ref 0.5–2.0)

## 2015-03-10 LAB — LIPASE, BLOOD: Lipase: 21 U/L (ref 11–51)

## 2015-03-10 LAB — TROPONIN I: Troponin I: <0.03 ng/mL (ref <0.031)

## 2015-03-10 MED ORDER — CARBIDOPA-LEVODOPA 25-100 MG PO TABS
0.5000 | ORAL_TABLET | Freq: Three times a day (TID) | ORAL | Status: DC
Start: 2015-03-10 — End: 2015-03-13
  Administered 2015-03-10 – 2015-03-13 (×8): 0.5 via ORAL
  Filled 2015-03-10 (×9): qty 1

## 2015-03-10 MED ORDER — ACETAMINOPHEN 650 MG RE SUPP: 650.0000 mg | Freq: Four times a day (QID) | RECTAL | Status: DC | PRN

## 2015-03-10 MED ORDER — ATORVASTATIN CALCIUM 10 MG PO TABS
10.0000 mg | ORAL_TABLET | Freq: Every day | ORAL | Status: DC
  Administered 2015-03-11 – 2015-03-13 (×3): 10 mg via ORAL
  Filled 2015-03-10 (×3): qty 1

## 2015-03-10 MED ORDER — QUETIAPINE FUMARATE 25 MG PO TABS
12.5000 mg | ORAL_TABLET | Freq: Every day | ORAL | Status: DC
  Administered 2015-03-12 – 2015-03-13 (×2): 12.5 mg via ORAL
  Filled 2015-03-10 (×2): qty 1

## 2015-03-10 MED ORDER — OXYCODONE HCL 5 MG PO TABS
5.0000 mg | ORAL_TABLET | ORAL | Status: DC | PRN
  Administered 2015-03-10: 5 mg via ORAL
  Filled 2015-03-10: qty 1

## 2015-03-10 MED ORDER — QUETIAPINE FUMARATE 25 MG PO TABS
25.0000 mg | ORAL_TABLET | Freq: Two times a day (BID) | ORAL | Status: DC
  Administered 2015-03-10 – 2015-03-13 (×6): 25 mg via ORAL
  Filled 2015-03-10 (×6): qty 1

## 2015-03-10 MED ORDER — MORPHINE SULFATE (PF) 2 MG/ML IV SOLN: 2.0000 mg | INTRAVENOUS | Status: DC | PRN

## 2015-03-10 MED ORDER — MELATONIN 3 MG PO CAPS: ORAL_CAPSULE | Freq: Every day | ORAL | Status: DC

## 2015-03-10 MED ORDER — SODIUM CHLORIDE 0.9 % IJ SOLN
3.0000 mL | Freq: Two times a day (BID) | INTRAMUSCULAR | Status: DC
  Administered 2015-03-10 – 2015-03-13 (×4): 3 mL via INTRAVENOUS

## 2015-03-10 MED ORDER — VITAMIN B-12 1000 MCG PO TABS
1000.0000 ug | ORAL_TABLET | Freq: Every day | ORAL | Status: DC
  Administered 2015-03-11 – 2015-03-13 (×3): 1000 ug via ORAL
  Filled 2015-03-10 (×3): qty 1

## 2015-03-10 MED ORDER — ASPIRIN EC 81 MG PO TBEC
81.0000 mg | DELAYED_RELEASE_TABLET | Freq: Every day | ORAL | Status: DC
  Administered 2015-03-11 – 2015-03-13 (×3): 81 mg via ORAL
  Filled 2015-03-10 (×4): qty 1

## 2015-03-10 MED ORDER — ONDANSETRON HCL 4 MG PO TABS: 4.0000 mg | ORAL_TABLET | Freq: Four times a day (QID) | ORAL | Status: DC | PRN

## 2015-03-10 MED ORDER — SODIUM CHLORIDE 0.9 % IV SOLN
INTRAVENOUS | Status: DC
  Administered 2015-03-11 – 2015-03-12 (×2): via INTRAVENOUS

## 2015-03-10 MED ORDER — ONDANSETRON HCL 4 MG/2ML IJ SOLN
4.0000 mg | Freq: Four times a day (QID) | INTRAMUSCULAR | Status: DC | PRN
Start: 2015-03-10 — End: 2015-03-13

## 2015-03-10 MED ORDER — VITAMIN D 1000 UNITS PO TABS
2000.0000 [IU] | ORAL_TABLET | Freq: Every day | ORAL | Status: DC
  Administered 2015-03-11 – 2015-03-13 (×3): 2000 [IU] via ORAL
  Filled 2015-03-10 (×3): qty 2

## 2015-03-10 MED ORDER — ACETAMINOPHEN 325 MG PO TABS: 650.0000 mg | ORAL_TABLET | Freq: Four times a day (QID) | ORAL | Status: DC | PRN

## 2015-03-10 NOTE — ED Provider Notes (Addendum)
[facility]  ____________________________________________  Time seen: Approximately 6:50 PM  I have reviewed the triage vital signs and the nursing notes.   HISTORY  Chief Complaint Urinary Retention  History Limited by altered mental status  HPI Shane Medcalf. is a 74 y.o. male with altered mental status apparently for the last few days. He was a recent discharge from the hospital here with a diagnosis of hypotension and acute cerebral thrombosis. Patient has not urinated for the last 2 days. Patient has abdominal pain. Badder scan showed at least thousand 200 cc in the bladder. Foley was placed and began draining urine looked slightly dark. She is unable to provide any history whatsoever he is not speaking intelligibly. Does seem to be moving his arms and legs equally. He will not follow commands. After thousand 100 cc of urine has been drained from his bladder he still complains of pain on palpation of his abdomen. Since treatment in the upper abdomen. History is obtained from EMS and by reviewing old records.  Past Medical History  Diagnosis Date  . Ulcer   . Hernia     LIH  . 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 Cancer ([place]) 2013    basal cell carcinoma back and face  . Personal history of colonic polyps   . Parkinson disease [facility]) April 2014  . Dementia     Patient Active Problem List   Diagnosis Date Noted  . Diastolic dysfunction 0000000  . Carotid atherosclerosis 03/07/2015  . TIA (transient ischemic attack) 03/06/2015  . 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 Hallucination 05/09/2014  . Bilateral hearing loss 01/10/2014  . Parkinson's disease ([place]) 01/10/2014  . 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 Personal history of colonic polyps 09/05/2011    Past Surgical History  Procedure Laterality Date  . Colonoscopy  2012    Dr Bary Castilla  . Hernia repair  2000    LIH  . Repair of perforated ulcer  1994  . Cholecystectomy   2004  . Hemicolectomy Right 08/2011  . Skin cancer excision Left 2013    neck  . Polypectomy  2006     benign    Current Outpatient Rx  Name  Route  Sig  Dispense  Refill  . aspirin EC 81 MG EC tablet   Oral   Take 1 tablet (81 mg total) by mouth daily.   120 tablet   0   . atorvastatin (LIPITOR) 10 MG tablet   Oral   Take 1 tablet (10 mg total) by mouth daily.   30 tablet   0   . carbidopa-levodopa (SINEMET IR) 25-100 MG tablet   Oral   Take 0.5 tablets by mouth 3 (three) times daily.   45 tablet   5   . Cholecalciferol (VITAMIN D PO)   Oral   Take 2,000 Units by mouth daily.         . Cyanocobalamin (VITAMIN B-12 PO)   Oral   Take 1,000 mcg by mouth daily.         . Melatonin 3 MG CAPS   Oral   Take by mouth at bedtime.         Marland Kitchen QUEtiapine (SEROQUEL) 25 MG tablet      1 tablet in the morning, 1/2 tablet in the afternoon, 1 at night. Patient can take an extra 1/2 tablet daily as needed   90 tablet   5   . rivastigmine (EXELON) 9.5 mg/24hr  Transdermal   Place 1 patch (9.5 mg total) onto the skin daily.   30 patch   5     Allergies Review of patient's allergies indicates no known allergies.  History reviewed. No pertinent family history.  Social History Social History  Substance Use Topics  . Smoking status: Former Smoker -- 0.50 packs/day for 10 years    Types: Cigarettes    Quit date: 06/05/1974  . Smokeless tobacco: Never Used  . Alcohol Use: No    Review of Systems  Unobtainable due to altered mental status  ____________________________________________   PHYSICAL EXAM:  VITAL SIGNS: ED Triage Vitals  Enc Vitals Group     BP 03/10/15 1813 144/73 mmHg     Pulse Rate 03/10/15 1813 78     Resp 03/10/15 1813 16     Temp 03/10/15 1813 97.8 F (36.6 C)     Temp Source 03/10/15 1813 Oral     SpO2 03/10/15 1813 99 %     Weight 03/10/15 1813 190 lb (86.183 kg)     Height 03/10/15 1813 5\' 6"  (1.676 m)     Head Cir --       Peak Flow --      Pain Score --      Pain Loc --      Pain Edu? --      Excl. in [place]? --     Constitutional: Patient lying with his eyes closed but will open them on talking to loud voice to the patient. Patient replies but am unable to copper[place] when he says.. Eyes: Conjunctivae are normal. PERRL. EOMI. he has some yellow crusting around both eyes. Head: Atraumatic. Nose: No congestion/rhinnorhea. Mouth/Throat: Mucous membranes are moist.  Oropharynx non-erythematous. Neck: No stridor.  Cardiovascular: Normal rate, regular rhythm. Grossly normal heart sounds.  Good peripheral circulation. Respiratory: Normal respiratory effort.  No retractions. Lungs CTAB. Gastrointestinal: Soft and diffusely tender to palpation.. No distention after placement of the Foley. No abdominal bruits. No CVA tenderness. Musculoskeletal: No lower extremity tenderness nor edema.  No joint effusions. Neurologic:  Patient is awake does seem to move his arms and legs well although it's difficult to tell Skin:  Skin is warm, dry and intact.  No rash noted. There looks like there may be some jaundice present. Rectal exam prostate somewhat enlarged but not tender not boggy     ____________________________________________   LABS (all labs ordered are listed, but only abnormal results are displayed)  Labs Reviewed  COMPREHENSIVE METABOLIC PANEL - Abnormal; Notable for the following:    Glucose, Bld 107 (*)    BUN 50 (*)    Creatinine, Ser 5.35 (*)    AST 14 (*)    ALT 6 (*)    Total Bilirubin 3.8 (*)    GFR calc non Af Amer 10 (*)    GFR calc Af Amer 11 (*)    All other components within normal limits  CBC WITH DIFFERENTIAL/PLATELET - Abnormal; Notable for the following:    WBC 12.8 (*)    RBC 3.99 (*)    Hemoglobin 12.5 (*)    HCT 37.0 (*)    Neutro Abs 9.3 (*)    Monocytes Absolute 1.3 (*)    All other components within normal limits  URINALYSIS COMPLETEWITH MICROSCOPIC ([facility] ONLY) - Abnormal; Notable  for the following:    Color, Urine YELLOW (*)    APPearance CLEAR (*)    Hgb urine dipstick 2+ (*)    Squamous Epithelial /  LPF 0-5 (*)    All other components within normal limits  URINE CULTURE  LIPASE, BLOOD  TROPONIN I  LACTIC ACID, PLASMA  LACTIC ACID, PLASMA   ____________________________________________  EKG EKG read and reviewed by me shows normal sinus rhythm rate of 75 normal axis no acute ST-T wave changes  ____________________________________________  RADIOLOGY  Asked x-ray read by radiology shows no acute disease Head CT read by radiology shows no acute disease. CT read by radiology says extravasation of urine all along the course of urinary tract.  ____________________________________________   PROCEDURES    ____________________________________________   INITIAL IMPRESSION / ASSESSMENT AND PLAN / ED COURSE  Pertinent labs & imaging results that were available during my care of the patient were reviewed by me and considered in my medical decision making (see chart for details). Critical care time 10 minutes  ____________________________________________   FINAL CLINICAL IMPRESSION(S) / ED DIAGNOSES  Final diagnoses:  Altered mental status, unspecified altered mental status type  Acute renal failure, unspecified acute renal failure type [facility])  Urinary tract infection with hematuria, site unspecified      Nena Polio, MD 03/10/15 2119  Discussed with urology they confirm that he needs a Foley catheter to drain. They will consult in the morning hospitalist will admit.  Nena Polio, MD 03/10/15 [phone]

## 2015-03-10 NOTE — H&P (Signed)
[facility] [place] NAME: Shane Mooney    MR#:  [id number]  DATE OF BIRTH:  09-06-1940   DATE OF ADMISSION:  03/10/2015  PRIMARY CARE PHYSICIAN: Loistine Chance, MD   REQUESTING/REFERRING PHYSICIAN: Cinda Quest  CHIEF COMPLAINT:   Chief Complaint  Patient presents with  . Urinary Retention    HISTORY OF PRESENT ILLNESS:  Shane Mooney  is a 74 y.o. male with a known history of lewy body dementia presenting with inability to urinate. Recently discharged from the hospital with discharge diagnoses of orthostatic hypotension as well as possible TIA. The patient is unable to provide any meaningful information given mental status/medical condition history obtained from wife who is present at bedside. She states that he had difficulty urinating for the past 3-4 days complaining of suprapubic pain however unable to really quantify or qualify his pain. She states now for the last 2 day duration he has not urinated. He is becoming increasingly weak, confused, lethargic over that time period.  PAST MEDICAL HISTORY:   Past Medical History  Diagnosis Date  . Ulcer   . Hernia     LIH  . 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 Cancer ([place]) 2013    basal cell carcinoma back and face  . Personal history of colonic polyps   . Parkinson disease [facility]) April 2014  . Dementia     PAST SURGICAL HISTORY:   Past Surgical History  Procedure Laterality Date  . Colonoscopy  2012    Dr Bary Castilla  . Hernia repair  2000    LIH  . Repair of perforated ulcer  1994  . Cholecystectomy  2004  . Hemicolectomy Right 08/2011  . Skin cancer excision Left 2013    neck  . Polypectomy  2006     benign    SOCIAL HISTORY:   Social History  Substance Use Topics  . Smoking status: Former Smoker -- 0.50 packs/day for 10 years    Types: Cigarettes    Quit date: 06/05/1974  . Smokeless tobacco: Never Used  . Alcohol Use: No    FAMILY  HISTORY:   Family History  Problem Relation Age of Onset  . Diabetes Neg Hx     DRUG ALLERGIES:  No Known Allergies  REVIEW OF SYSTEMS:  Unable to obtain given patient's mental status/medical condition  MEDICATIONS AT HOME:   Prior to Admission medications   Medication Sig Start Date End Date Taking? Authorizing Provider  aspirin EC 81 MG EC tablet Take 1 tablet (81 mg total) by mouth daily. 03/07/15   Bettey Costa, MD  atorvastatin (LIPITOR) 10 MG tablet Take 1 tablet (10 mg total) by mouth daily. 03/07/15   Bettey Costa, MD  carbidopa-levodopa (SINEMET IR) 25-100 MG tablet Take 0.5 tablets by mouth 3 (three) times daily. 01/25/15   Eustace Quail Tat, DO  Cholecalciferol (VITAMIN D PO) Take 2,000 Units by mouth daily.    Historical Provider, MD  Cyanocobalamin (VITAMIN B-12 PO) Take 1,000 mcg by mouth daily.    Historical Provider, MD  Melatonin 3 MG CAPS Take by mouth at bedtime.    Historical Provider, MD  QUEtiapine (SEROQUEL) 25 MG tablet 1 tablet in the morning, 1/2 tablet in the afternoon, 1 at night. Patient can take an extra 1/2 tablet daily as needed 01/25/15   Eustace Quail Tat, DO  rivastigmine (EXELON) 9.5 mg/24hr Place 1 patch (9.5 mg total) onto the skin daily. 01/25/15  Eustace Quail Tat, DO      VITAL SIGNS:  Blood pressure 134/72, pulse 78, temperature 97.8 F (36.6 C), temperature source Oral, resp. rate 20, height 5\' 6"  (1.676 m), weight 190 lb (86.183 kg), SpO2 97 %.  PHYSICAL EXAMINATION:  VITAL SIGNS: Filed Vitals:   03/10/15 1813 03/10/15 2119  BP: 144/73 134/72  Pulse: 78 78  Temp: 97.8 F (36.6 C)   Resp: 16 20   GENERAL:74 y.o.male currently in moderate acute distress. Mental status HEAD: Normocephalic, atraumatic.  EYES: Pupils equal, round, reactive to light. Extraocular muscles intact. No scleral icterus.  MOUTH: Moist mucosal membrane. Dentition intact. No abscess noted.  EAR, NOSE, THROAT: Clear without exudates. No external lesions.  NECK: Supple. No  thyromegaly. No nodules. No JVD.  PULMONARY: Clear to ascultation, without wheeze rails or rhonci. No use of accessory muscles, Good respiratory effort. good air entry bilaterally CHEST: Nontender to palpation.  CARDIOVASCULAR: S1 and S2. Regular rate and rhythm. No murmurs, rubs, or gallops. No edema. Pedal pulses 2+ bilaterally.  GASTROINTESTINAL: Soft, nontender, nondistended. No masses. Positive bowel sounds. No hepatosplenomegaly.  MUSCULOSKELETAL: No swelling, clubbing, or edema. Range of motion full in all extremities.  NEUROLOGIC: Difficult to fully assess given patient's mental status/condition he is able to follow some commands as well as a spontaneous movement in all extremities SKIN: No ulceration, lesions, rashes, or cyanosis. Skin warm and dry. Turgor intact.  PSYCHIATRIC:Difficult to fully assess given patient's mental status/medical condition  Mood, affect blunted he patient is awake  LABORATORY PANEL:   CBC  Recent Labs Lab 03/10/15 1818  WBC 12.8*  HGB 12.5*  HCT 37.0*  PLT 223   ------------------------------------------------------------------------------------------------------------------  Chemistries   Recent Labs Lab 03/10/15 1818  NA 136  K 3.9  CL 102  CO2 26  GLUCOSE 107*  BUN 50*  CREATININE 5.35*  CALCIUM 9.4  AST 14*  ALT 6*  ALKPHOS 54  BILITOT 3.8*   ------------------------------------------------------------------------------------------------------------------  Cardiac Enzymes  Recent Labs Lab 03/10/15 1818  TROPONINI <0.03   ------------------------------------------------------------------------------------------------------------------  RADIOLOGY:  Ct Abdomen Pelvis Wo Contrast  03/10/2015  CLINICAL DATA:  Altered mental status, urinary retention, discharge 3 days ago for stroke, with acute renal failure today, abdominal distention and abdominal pain, history Parkinson's EXAM: CT ABDOMEN AND PELVIS WITHOUT CONTRAST  TECHNIQUE: Multidetector CT imaging of the abdomen and pelvis was performed following the standard protocol without IV contrast. Sagittal and coronal MPR images reconstructed from axial data set. Oral contrast not administered. COMPARISON:  None FINDINGS: Bibasilar atelectasis. Gallbladder surgically absent. Liver, spleen, pancreas, and RIGHT adrenal gland normal. Low-attenuation nodule LEFT adrenal gland 2.1 x 1.6 cm consistent with adrenal adenoma. Kidneys demonstrate BILATERAL hydronephrosis with high attenuation material within the renal collecting systems bilaterally, question residual of administered contrast for recent CTA exams. Significant perinephric fluid/edema bilaterally. On the LEFT, high attenuation material is seen dependently in the LEFT perinephric space along Zuckerkandl's fascia consistent with fornyceal and urine extravasation. Foley catheter and small amount of excreted contrast within urinary bladder. Marked prostatic enlargement, gland 7.2 x 5.8 x 7.3 cm. Additional high attenuation material is extraluminal to the bladder to the anterior RIGHT pelvis. This is remote from the RIGHT ureter and appears extraperitoneal. As this appears to be in the space of Retzius, possibility of bladder rupture is raised. No contrast extravasation in the RIGHT perinephric space is identified. Scattered low-attenuation ascites throughout abdomen and pelvis. BILATERAL inguinal hernias containing fat. Gastric wall appears thickened but stomach is under distended,  could potentially represent artifact. Bowel loops grossly unremarkable. Scattered atherosclerotic calcifications. No mass or adenopathy. Osseous structures unremarkable. IMPRESSION: Scattered low-attenuation ascites. BILATERAL hydronephrosis and minimal ureteral dilatation with evidence of foreign axial rupture LEFT kidney and extravasation of opacified urine into the perinephric space. Additional extravasated contrast is seen extraperitoneal in the  anterior RIGHT pelvis, located within the space of Retzius near urinary bladder, cannot exclude bladder rupture. Marked prostatic enlargement. BILATERAL inguinal hernias containing fat. Cannot exclude gastric wall thickening though this could be artifact from underdistention. LEFT adrenal adenoma. Findings called to Dr. Cinda Quest on 03/10/2015 at 2028 hours. Electronically Signed   By: Lavonia Dana M.D.   On: 03/10/2015 20:29   Ct Head Wo Contrast  03/10/2015  CLINICAL DATA:  Altered mental status.  Recent CVA. EXAM: CT HEAD WITHOUT CONTRAST TECHNIQUE: Contiguous axial images were obtained from the base of the skull through the vertex without intravenous contrast. COMPARISON:  Head CT 03/06/2015, brain MRI 03/07/2015 FINDINGS: No intracranial hemorrhage, mass effect, or midline shift. Generalized atrophy and periventricular white matter change, stable from recent prior exam. No hydrocephalus. The basilar cisterns are patent. No evidence of territorial infarct. No intracranial fluid collection. Calvarium is intact. Included paranasal sinuses and mastoid air cells are well aerated. IMPRESSION: Stable chronic change without acute intracranial abnormality. Electronically Signed   By: Jeb Levering M.D.   On: 03/10/2015 20:14   Dg Chest Portable 1 View  03/10/2015  CLINICAL DATA:  Urinary retention.  Hypotension. EXAM: PORTABLE CHEST 1 VIEW COMPARISON:  03/06/2015. FINDINGS: The heart size and mediastinal contours are within normal limits. Aortic atherosclerosis noted. Both lungs are clear. The visualized skeletal structures are unremarkable. IMPRESSION: No active disease. Electronically Signed   By: Kerby Moors M.D.   On: 03/10/2015 19:17    EKG:   Orders placed or performed during the hospital encounter of 03/10/15  . ED EKG  . ED EKG    IMPRESSION AND PLAN:    74 year old Caucasian gentleman history of Lewy body dementia presenting with difficulty urinating  1. Acute renal failure,  obstructive: Foley in place has voided approximately 2 L since placement of Foley follow renal function and urine output urology consult and will hold Exelon patch tonight secondary to hypertension 2. Hyperlipidemia unspecified Lipitor 3. Parkinson's carbidopa levodopa 4. Venous embolism prophylactic SCDs   All the records are reviewed and case discussed with ED provider. Management plans discussed with the patient, family and they are in agreement.  CODE STATUS:  full  TOTAL TIME TAKING CARE OF THIS PATIENT:  36minutes.    Hower,  Karenann Cai.D on 03/10/2015 at 10:21 PM  Between 7am to 6pm - Pager - [phone]  After 6pm: House Pager: - [facility]  Office  [phone]  CC: Primary care physician; Loistine Chance, MD

## 2015-03-10 NOTE — Progress Notes (Signed)
PHARMACIST - PHYSICIAN ORDER COMMUNICATION  CONCERNING: P&T Medication Policy on Herbal Medications  DESCRIPTION:  This patient's order for:  Melatonin has been noted.  This product(s) is classified as an "herbal" or natural product. Due to a lack of definitive safety studies or FDA approval, nonstandard manufacturing practices, plus the potential risk of unknown drug-drug interactions while on inpatient medications, the Pharmacy and Therapeutics Committee does not permit the use of "herbal" or natural products of this type within [facility].   ACTION TAKEN: The pharmacy department is unable to verify this order at this time. Please reevaluate patient's clinical condition at discharge and address if the herbal or natural product(s) should be resumed at that time.   

## 2015-03-10 NOTE — ED Notes (Signed)
Patient transported to CT 

## 2015-03-10 NOTE — ED Notes (Signed)
Pt arrived via EMS from home with reports of urinary retention. Pt has not urinated since Thursday per wife. Pt was here recently for hypotension. Pt has lower abdominal distention and rigid abdomen.

## 2015-03-10 NOTE — Progress Notes (Signed)
Cased discussed with Dr. Cinda Quest.    74 yo M s/p recent stroke with massive urinary retention, acute renal failure, Cr 5.35 .  CT scan shows bilateral hydronephrosis secondary to bladder outlet obstuction, evidence of fornyceal rupture, and possible questionable extraperitoneal bladder injury (imaging reviewed personally).  Foley appears to be in good positioned with a now decompressed bladder, prostatic enlargement noted.  Mainstay of treatment is Foley for decompression of the collecting system.  If there is indeed a small extraperitoneal rupture, these are managed conservatively.  Urology to see patient in the AM an make additional formal recommendations, however, patient will likely need Foley for at least 1-2 weeks with cystogram prior to removal to ensure bladder no evidence of extravasation.    Please monitor for post obstructive diuresis overnight and replace fluids/ electrolytes as needed.   Hollice Espy, MD Urology

## 2015-03-11 DIAGNOSIS — R339 Retention of urine, unspecified: Secondary | ICD-10-CM

## 2015-03-11 LAB — CBC
HEMATOCRIT: 32.5 % — AB (ref 40.0–52.0)
HEMOGLOBIN: 11.1 g/dL — AB (ref 13.0–18.0)
MCH: 31.1 pg (ref 26.0–34.0)
MCHC: 34.1 g/dL (ref 32.0–36.0)
MCV: 91.2 fL (ref 80.0–100.0)
Platelets: 181 10*3/uL (ref 150–440)
RBC: 3.57 MIL/uL — ABNORMAL LOW (ref 4.40–5.90)
RDW: 14.1 % (ref 11.5–14.5)
WBC: 8.2 10*3/uL (ref 3.8–10.6)

## 2015-03-11 LAB — BASIC METABOLIC PANEL
ANION GAP: 4 — AB (ref 5–15)
BUN: 26 mg/dL — ABNORMAL HIGH (ref 6–20)
CALCIUM: 8.3 mg/dL — AB (ref 8.9–10.3)
CHLORIDE: 111 mmol/L (ref 101–111)
CO2: 25 mmol/L (ref 22–32)
Creatinine, Ser: 1.44 mg/dL — ABNORMAL HIGH (ref 0.61–1.24)
GFR calc Af Amer: 54 mL/min — ABNORMAL LOW (ref >60)
GFR calc non Af Amer: 47 mL/min — ABNORMAL LOW (ref >60)
GLUCOSE: 89 mg/dL (ref 65–99)
POTASSIUM: 3.8 mmol/L (ref 3.5–5.1)
Sodium: 140 mmol/L (ref 135–145)

## 2015-03-11 MED ORDER — RIVASTIGMINE 9.5 MG/24HR TD PT24
9.5000 mg | MEDICATED_PATCH | Freq: Every day | TRANSDERMAL | Status: DC
Start: 2015-03-11 — End: 2015-03-13
  Administered 2015-03-11 – 2015-03-13 (×3): 9.5 mg via TRANSDERMAL
  Filled 2015-03-11 (×3): qty 1

## 2015-03-11 MED ORDER — HALOPERIDOL LACTATE 5 MG/ML IJ SOLN
5.0000 mg | Freq: Once | INTRAMUSCULAR | Status: AC
  Administered 2015-03-11: 5 mg via INTRAVENOUS
  Filled 2015-03-11: qty 1

## 2015-03-11 MED ORDER — ENOXAPARIN SODIUM 40 MG/0.4ML ~~LOC~~ SOLN
40.0000 mg | SUBCUTANEOUS | Status: DC
  Administered 2015-03-11 – 2015-03-12 (×2): 40 mg via SUBCUTANEOUS
  Filled 2015-03-11 (×2): qty 0.4

## 2015-03-11 NOTE — Consult Note (Signed)
Urology Consult  I have been asked to see the patient by Dr. Cinda Quest, for evaluation and management of urinary retention, bilateral hydronephrosis, forniceal rupture.  Chief Complaint: Difficulty voiding  History of Present Illness: Shane Mooney. is a 74 y.o. year old male with a history of Parkinson's disease with a Lewy body dementia, recent admission for  Possible TIA who presented to the emergency room overnight with inability to void x several days. He was found to be in profound urinary retention with greater than 2 L in the bladder at the time of Foley catheter placement. He was also in acute renal failure with a creatinine of 5.35. He underwent CT scan which showed evidence of bilateral hydroureteronephrosis, extravasation of perinephric fluid consistent with forniceal rupture, and a questionable possible extraperitoneal bladder injury with contrast material seen within the space of Retzius.  No falls or any other mechanism of injury.  Patient is unable to ride additional history but his wife tells [place] that he's had difficulty voiding over the past 2-3 days. He is very little output. They ultimately presented to the emergency room when he developed lower abdominal discomfort.  He was followed in the remote past by Dr. Jacqlyn Larsen of [facility] urology. He has not seen him in quite some time. He currently takes no medications for prostate.   Baseline creatinine appears to be 1.0 earlier this month.   Past Medical History  Diagnosis Date  . Ulcer   . Hernia     LIH  . 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 Cancer ([place]) 2013    basal cell carcinoma back and face  . Personal history of colonic polyps   . Parkinson disease [facility]) April 2014  . Dementia     Past Surgical History  Procedure Laterality Date  . Colonoscopy  2012    Dr Bary Castilla  . Hernia repair  2000    LIH  . Repair of perforated ulcer  1994  . Cholecystectomy  2004  . Hemicolectomy Right 08/2011  . Skin  cancer excision Left 2013    neck  . Polypectomy  2006     benign    Home Medications:    Medication List    ASK your doctor about these medications        aspirin 81 MG EC tablet  Take 1 tablet (81 mg total) by mouth daily.     atorvastatin 10 MG tablet  Commonly known as:  LIPITOR  Take 1 tablet (10 mg total) by mouth daily.     carbidopa-levodopa 25-100 MG tablet  Commonly known as:  SINEMET IR  Take 0.5 tablets by mouth 3 (three) times daily.     Melatonin 3 MG Caps  Take 1 capsule by mouth at bedtime.     QUEtiapine 25 MG tablet  Commonly known as:  SEROQUEL  1 tablet in the morning, 1/2 tablet in the afternoon, 1 at night. Patient can take an extra 1/2 tablet daily as needed     rivastigmine 9.5 mg/24hr  Commonly known as:  EXELON  Place 1 patch (9.5 mg total) onto the skin daily.     VITAMIN B-12 PO  Take 1,000 mcg by mouth daily.     VITAMIN D PO  Take 2,000 Units by mouth daily.        Allergies: No Known Allergies  Family History  Problem Relation Age of Onset  . Diabetes Neg Hx     Social History:  reports that he quit smoking about 40 years ago. His smoking use included Cigarettes. He has a 5 pack-year smoking history. He has never used smokeless tobacco. He reports that he does not drink alcohol or use illicit drugs.  ROS: Unable to complete today due to profound dementia.  Physical Exam:  Vital signs in last 24 hours: Temp:  [97.5 F (36.4 C)-98.5 F (36.9 C)] 98.5 F (36.9 C) (12/18 0447) Pulse Rate:  [72-82] 72 (12/18 0447) Resp:  [16-22] 18 (12/18 0447) BP: (127-148)/(66-83) 140/74 mmHg (12/18 0447) SpO2:  [97 %-100 %] 98 % (12/18 0447) Weight:  [190 lb (86.183 kg)] 190 lb (86.183 kg) (12/17 1813) Constitutional:  Alert but not oriented, No acute distress.  Not able to answer questions appropriately. HEENT: [place] AT, moist mucus membranes.  Trachea midline, no masses Cardiovascular: Regular rate and rhythm, no clubbing, cyanosis, or  edema. Respiratory: Normal respiratory effort, lungs clear bilaterally GI: Abdomen is soft, nontender, nondistended, no abdominal masses.   GU: No CVA tenderness.  Bladder is nonpalpable, no suprapubic tenderness. Circumcised phallus with Foley catheter in place draining light pink urine, no clots or debris.  Bilateral descended testicles. Skin: No rashes, bruises or suspicious lesions Lymph: No cervical or inguinal adenopathy Neurologic: Grossly intact, no focal deficits, moving all 4 extremities Psychiatric: Flat affect.   Laboratory Data:   Recent Labs  03/10/15 1818 03/11/15 0624  WBC 12.8* 8.2  HGB 12.5* 11.1*  HCT 37.0* 32.5*    Recent Labs  03/10/15 1818 03/11/15 0624  NA 136 140  K 3.9 3.8  CL 102 111  CO2 26 25  GLUCOSE 107* 89  BUN 50* 26*  CREATININE 5.35* 1.44*  CALCIUM 9.4 8.3*     Ref Range 1d ago  5d ago      Color, Urine YELLOW  YELLOW (A) YELLOW (A)    APPearance CLEAR  CLEAR (A) CLEAR (A)    Glucose, UA NEGATIVE mg/dL NEGATIVE NEGATIVE    Bilirubin Urine NEGATIVE  NEGATIVE NEGATIVE    Ketones, ur NEGATIVE mg/dL NEGATIVE TRACE (A)    Specific Gravity, Urine 1.005 - 1.030  1.024 1.016    Hgb urine dipstick NEGATIVE  2+ (A) NEGATIVE    pH 5.0 - 8.0  5.0 5.0    Protein, ur NEGATIVE mg/dL NEGATIVE 30 (A)    Nitrite NEGATIVE  NEGATIVE NEGATIVE    Leukocytes, UA NEGATIVE  NEGATIVE NEGATIVE    RBC / HPF 0 - 5 RBC/hpf 6-30 0-5    WBC, UA 0 - 5 WBC/hpf 6-30 0-5    Bacteria, UA NONE SEEN  NONE SEEN NONE SEEN    Squamous Epithelial / LPF NONE SEEN  0-5 (A) NONE SEEN    Mucous  PRESENT PRESENT          Radiologic Imaging: Ct Abdomen Pelvis Wo Contrast  03/10/2015  CLINICAL DATA:  Altered mental status, urinary retention, discharge 3 days ago for stroke, with acute renal failure today, abdominal distention and abdominal pain, history Parkinson's EXAM: CT ABDOMEN AND PELVIS WITHOUT CONTRAST TECHNIQUE: Multidetector CT imaging of the  abdomen and pelvis was performed following the standard protocol without IV contrast. Sagittal and coronal MPR images reconstructed from axial data set. Oral contrast not administered. COMPARISON:  None FINDINGS: Bibasilar atelectasis. Gallbladder surgically absent. Liver, spleen, pancreas, and RIGHT adrenal gland normal. Low-attenuation nodule LEFT adrenal gland 2.1 x 1.6 cm consistent with adrenal adenoma. Kidneys demonstrate BILATERAL hydronephrosis with high attenuation material within the renal collecting systems bilaterally,  question residual of administered contrast for recent CTA exams. Significant perinephric fluid/edema bilaterally. On the LEFT, high attenuation material is seen dependently in the LEFT perinephric space along Zuckerkandl's fascia consistent with fornyceal and urine extravasation. Foley catheter and small amount of excreted contrast within urinary bladder. Marked prostatic enlargement, gland 7.2 x 5.8 x 7.3 cm. Additional high attenuation material is extraluminal to the bladder to the anterior RIGHT pelvis. This is remote from the RIGHT ureter and appears extraperitoneal. As this appears to be in the space of Retzius, possibility of bladder rupture is raised. No contrast extravasation in the RIGHT perinephric space is identified. Scattered low-attenuation ascites throughout abdomen and pelvis. BILATERAL inguinal hernias containing fat. Gastric wall appears thickened but stomach is under distended, could potentially represent artifact. Bowel loops grossly unremarkable. Scattered atherosclerotic calcifications. No mass or adenopathy. Osseous structures unremarkable. IMPRESSION: Scattered low-attenuation ascites. BILATERAL hydronephrosis and minimal ureteral dilatation with evidence of foreign axial rupture LEFT kidney and extravasation of opacified urine into the perinephric space. Additional extravasated contrast is seen extraperitoneal in the anterior RIGHT pelvis, located within the space  of Retzius near urinary bladder, cannot exclude bladder rupture. Marked prostatic enlargement. BILATERAL inguinal hernias containing fat. Cannot exclude gastric wall thickening though this could be artifact from underdistention. LEFT adrenal adenoma. Findings called to Dr. Cinda Quest on 03/10/2015 at 2028 hours. Electronically Signed   By: Lavonia Dana M.D.   On: 03/10/2015 20:29   Ct Head Wo Contrast  03/10/2015  CLINICAL DATA:  Altered mental status.  Recent CVA. EXAM: CT HEAD WITHOUT CONTRAST TECHNIQUE: Contiguous axial images were obtained from the base of the skull through the vertex without intravenous contrast. COMPARISON:  Head CT 03/06/2015, brain MRI 03/07/2015 FINDINGS: No intracranial hemorrhage, mass effect, or midline shift. Generalized atrophy and periventricular white matter change, stable from recent prior exam. No hydrocephalus. The basilar cisterns are patent. No evidence of territorial infarct. No intracranial fluid collection. Calvarium is intact. Included paranasal sinuses and mastoid air cells are well aerated. IMPRESSION: Stable chronic change without acute intracranial abnormality. Electronically Signed   By: Jeb Levering M.D.   On: 03/10/2015 20:14   Dg Chest Portable 1 View  03/10/2015  CLINICAL DATA:  Urinary retention.  Hypotension. EXAM: PORTABLE CHEST 1 VIEW COMPARISON:  03/06/2015. FINDINGS: The heart size and mediastinal contours are within normal limits. Aortic atherosclerosis noted. Both lungs are clear. The visualized skeletal structures are unremarkable. IMPRESSION: No active disease. Electronically Signed   By: Kerby Moors M.D.   On: 03/10/2015 19:17    Impression/Assessment/ Plan:  74 year old male with Parkinson's with Lewy body dementia, BPH, possible recent TIA with acute urinary retention with associated acute renal failure.  Creatinine has trended down remarkably since Foley catheter placement.  CT scan shows sequela of bladder outlet obstruction including  bilateral hydroureteronephrosis, extravasation of urine from kidneys consistent with forniceal rupture and questionable extraperitoneal bladder rupture. Review of CT scan shows some contrast material residual from previous CTA scan in the space of Retzius. This could be retained urine and an anterior diverticulum versus small extraperitoneal bladder injury although no mechanism of injury which would be quite unusual.  1. Acute urinary retention- status post Foley. Secondary to BPH, Parkinson's, and possible TIA along with other medical comorbidities. Suspect retention is multifactorial.  Recommend maintaining Foley catheter for one to 2 weeks. Hesitant to start any Flomax or alpha blocker given patient's history of low blood pressure.  2. Acute renal failure-secondary to #1, improving.  Please continue to  monitor for postobstructive diuresis and electrolyte abnormalities.  3. Perinephric fluid- forniceal rupture secondary to bladder outlet obstruction. Conservative management with decompression of collecting system. No further intervention needed.  4. Possible extra peritoneal bladder injury- not convincing on CT scan although possible. Treatment for this would be Foley catheter 1-2 weeks with a cystogram prior to Foley removal.  5.  Leukocytosis- improving. No evidence of obvious UTI on UA. Follow-up urine culture and treat as needed. Afebrile.  We will arrange for outpatient follow-up in 1-2 weeks with a cystogram prior to ensure no leakage from bladder. Recommendations were communicated both Dr. Posey Pronto as well as the patient's wife today.  03/11/2015, 9:35 AM  Hollice Espy,  MD   Thank you for involving me in this patient's care.  Case discussed today with Dr. Posey Pronto I will continue to follow along. Please page with any further questions or concerns.

## 2015-03-11 NOTE — Discharge Instructions (Signed)
Acute Urinary Retention, Male Acute urinary retention is when you are unable to pee (urinate). Acute urinary retention is common in older men. Prostates can get bigger, which blocks the flow of pee.  HOME CARE 1. Drink enough fluids to keep your pee clear or pale yellow. 2. If you are sent home with a tube that drains the bladder (catheter), there will be a drainage bag attached to it. There are two types of bags. One is big that you can wear at night without having to empty it. One is smaller and needs to be emptied more often.  Keep the drainage bag empty.  Keep the drainage bag lower than your catheter. 3. Only take medicine as told by your doctor. GET HELP IF: 1. You have a low-grade fever. 2. You have spasms or you are leaking pee when you have spasms. GET HELP RIGHT AWAY IF:  1. You have chills or a fever. 2. Your catheter stops draining pee. 3. Your catheter falls out. 4. You have increased bleeding that does not stop after you have rested and increased the amount of fluids you had been drinking. MAKE SURE YOU:  1. Understand these instructions. 2. Will watch your condition. 3. Will get help right away if you are not doing well or get worse.   This information is not intended to replace advice given to you by your health care provider. Make sure you discuss any questions you have with your health care provider.   Document Released: 08/27/2007 Document Revised: 07/25/2014 Document Reviewed: 08/19/2012 Elsevier Interactive Patient Education 2016 [place] A cystogram, which is also called cystography, is a type of X-ray exam that is used to check for problems with the bladder. You may need this exam if you have blood in your urine (hematuria), urinary tract infections that keep coming back, or possible damage to your bladder from an injury.  During the exam, a type of dye will be injected into your bladder. This dye shows up on an X-ray. The dye makes it easier for  your health care provider to see your bladder and check for any possible problems. A cystogram can be used to help diagnose problems such as: Cysts. Blood clots (hematoma). Punctures, tears, or other damage to the bladder. Bladder tumors. Backward flow of urine from the bladder to the ureter (vesicoureteral reflux). An abnormal tunnel (fistula) from the bladder to another organ. LET [facility] CARE PROVIDER KNOW ABOUT: Any allergies you have. All medicines you are taking, including vitamins, herbs, eye drops, creams, and over-the-counter medicines. Previous problems you or members of your family have had with the use of anesthetics or injectable dyes. Any blood disorders you have. Previous surgeries you have had. Any medical conditions you may have. If you are pregnant or you think that you may be pregnant. RISKS AND COMPLICATIONS Generally, this is a safe procedure. However, problems can occur, including: Exposure to a small amount of radiation. Urinary tract infection. Allergic reaction to the dye. This is rare. BEFORE THE PROCEDURE  Ask your health care provider about: Changing or stopping your regular medicines. This is especially important if you are taking diabetes medicines or blood thinners. Taking medicines such as aspirin and ibuprofen. These medicines can thin your blood. Do not take these medicines before your procedure if your health care provider instructs you not to. Follow your health care provider's instructions about eating or drinking restrictions. PROCEDURE  You will lie on your back on an X-ray table. A  thin, flexible tube (catheter) will be passed through your urethra and inserted into your bladder. The urethra is the tube through which urine exits your bladder. Dye will be injected into your bladder through the catheter. When your bladder is full, the catheter will be clamped. X-ray images of your bladder area will be taken. You may be asked to move into different  positions for some of the scans. The catheter will then be removed. In some cases, you may be asked to urinate while more X-rays are taken of your bladder and urethra (voiding cystogram). The procedure may vary among health care providers and hospitals. AFTER THE PROCEDURE Return to your normal activities and your normal diet as directed by your health care provider. Drink enough fluid to keep your urine clear or pale yellow. This will help to flush the dye out of your body. It is your responsibility to obtain your test results. Ask your health care provider or the department performing the test when and how you will get your results.   This information is not intended to replace advice given to you by your health care provider. Make sure you discuss any questions you have with your health care provider.   Document Released: 04/11/2004 Document Revised: 03/31/2014 Document Reviewed: 12/20/2013 Elsevier Interactive Patient Education 2016 [place], Adult A Foley catheter is a soft, flexible tube. This tube is placed into your bladder to drain pee (urine). If you go home with this catheter in place, follow the instructions below. TAKING CARE OF THE CATHETER 4. Wash your hands with soap and water. 5. Put soap and water on a clean washcloth.  Clean the skin where the tube goes into your body.  Clean away from the tube site.  Never wipe toward the tube.  Clean the area using a circular motion.  Remove all the soap. Pat the area dry with a clean towel. For males, reposition the skin that covers the end of the penis (foreskin). 6. Attach the tube to your leg with tape or a leg strap. Do not stretch the tube tight. If you are using tape, remove any stickiness left behind by past tape you used. 7. Keep the drainage bag below your hips. Keep it off the floor. 8. Check your tube during the day. Make sure it is working and draining. Make sure the tube does not curl, twist,  or bend. 9. Do not pull on the tube or try to take it out. TAKING CARE OF THE DRAINAGE BAGS You will have a large overnight drainage bag and a small leg bag. You may wear the overnight bag any time. Never wear the small bag at night. Follow the directions below. Emptying the Drainage Bag Empty your drainage bag when it is  - full or at least 2-3 times a day. 3. Wash your hands with soap and water. 4. Keep the drainage bag below your hips. 5. Hold the dirty bag over the toilet or clean container. 6. Open the pour spout at the bottom of the bag. Empty the pee into the toilet or container. Do not let the pour spout touch anything. 7. Clean the pour spout with a gauze pad or cotton ball that has rubbing alcohol on it. 8. Close the pour spout. 9. Attach the bag to your leg with tape or a leg strap. 10. Wash your hands well. Changing the Drainage Bag Change your bag once a month or sooner if it starts to smell or look dirty.  5. Wash your hands with soap and water. 6. Pinch the rubber tube so that pee does not spill out. 7. Disconnect the catheter tube from the drainage tube at the connection valve. Do not let the tubes touch anything. 8. Clean the end of the catheter tube with an alcohol wipe. Clean the end of a the drainage tube with a different alcohol wipe. 9. Connect the catheter tube to the drainage tube of the clean drainage bag. 10. Attach the new bag to the leg with tape or a leg strap. Avoid attaching the new bag too tightly. 11. Wash your hands well. Cleaning the Drainage Bag 4. Wash your hands with soap and water. 5. Wash the bag in warm, soapy water. 6. Rinse the bag with warm water. 7. Fill the bag with a mixture of white vinegar and water (1 cup vinegar to 1 quart warm water [.2 liter vinegar to 1 liter warm water]). Close the bag and soak it for 30 minutes in the solution. 8. Rinse the bag with warm water. 9. Hang the bag to dry with the pour spout open and hanging  downward. 10. Store the clean bag (once it is dry) in a clean plastic bag. 11. Wash your hands well. PREVENT INFECTION  Wash your hands before and after touching your tube.  Take showers every day. Wash the skin where the tube enters your body. Do not take baths. Replace wet leg straps with dry ones, if this applies.  Do not use powders, sprays, or lotions on the genital area. Only use creams, lotions, or ointments as told by your doctor.  For females, wipe from front to back after going to the bathroom.  Drink enough fluids to keep your pee clear or pale yellow unless you are told not to have too much fluid (fluid restriction).  Do not let the drainage bag or tubing touch or lie on the floor.  Wear cotton underwear to keep the area dry. GET HELP IF:  Your pee is cloudy or smells unusually bad.  Your tube becomes clogged.  You are not draining pee into the bag or your bladder feels full.  Your tube starts to leak. GET HELP RIGHT AWAY IF:  You have pain, puffiness (swelling), redness, or yellowish-white fluid (pus) where the tube enters the body.  You have pain in the belly (abdomen), legs, lower back, or bladder.  You have a fever.  You see blood fill the tube, or your pee is pink or red.  You feel sick to your stomach (nauseous), throw up (vomit), or have chills.  Your tube gets pulled out. MAKE SURE YOU:   Understand these instructions.  Will watch your condition.  Will get help right away if you are not doing well or get worse.   This information is not intended to replace advice given to you by your health care provider. Make sure you discuss any questions you have with your health care provider.   Document Released: 07/05/2012 Document Revised: 03/31/2014 Document Reviewed: 07/05/2012 Elsevier Interactive Patient Education [employer].

## 2015-03-11 NOTE — Progress Notes (Addendum)
[place] [place] NAME: Shane Mooney    MR#:  [id number]  DATE OF BIRTH:  Aug 13, 1940  SUBJECTIVE:   Pt sedated from haldol given in ER. Came in with urinanry retention REVIEW OF SYSTEMS:   Review of Systems  Unable to perform ROS: acuity of condition   DRUG ALLERGIES:  No Known Allergies  VITALS:  Blood pressure 140/74, pulse 72, temperature 98.5 F (36.9 C), temperature source Oral, resp. rate 18, height 5\' 6"  (1.676 m), weight 86.183 kg (190 lb), SpO2 98 %.  PHYSICAL EXAMINATION:   Physical Exam  GENERAL:  74 y.o.-year-old patient lying in the bed with no acute distress.  EYES: Pupils equal, round, reactive to light and accommodation. No scleral icterus. Extraocular muscles intact.  HEENT: Head atraumatic, normocephalic. Oropharynx and nasopharynx clear.  NECK:  Supple, no jugular venous distention. No thyroid enlargement, no tenderness.  LUNGS: Normal breath sounds bilaterally, no wheezing, rales, rhonchi. No use of accessory muscles of respiration.  CARDIOVASCULAR: S1, S2 normal. No murmurs, rubs, or gallops.  ABDOMEN: Soft, nontender, nondistended. Bowel sounds present. No organomegaly or mass.  EXTREMITIES: No cyanosis, clubbing or edema b/l.    NEUROLOGIC: Cranial nerves II through XII are intact. No focal Motor or sensory deficits b/l.   PSYCHIATRIC:  patient is alert and oriented x 3.  SKIN: No obvious rash, lesion, or ulcer.   LABORATORY PANEL:   CBC  Recent Labs Lab 03/11/15 0624  WBC 8.2  HGB 11.1*  HCT 32.5*  PLT 181    Chemistries   Recent Labs Lab 03/10/15 1818 03/11/15 0624  NA 136 140  K 3.9 3.8  CL 102 111  CO2 26 25  GLUCOSE 107* 89  BUN 50* 26*  CREATININE 5.35* 1.44*  CALCIUM 9.4 8.3*  AST 14*  --   ALT 6*  --   ALKPHOS 54  --   BILITOT 3.8*  --     Cardiac Enzymes  Recent Labs Lab 03/10/15 1818  TROPONINI <0.03    RADIOLOGY:  Ct Abdomen Pelvis Wo  Contrast  03/10/2015  CLINICAL DATA:  Altered mental status, urinary retention, discharge 3 days ago for stroke, with acute renal failure today, abdominal distention and abdominal pain, history Parkinson's EXAM: CT ABDOMEN AND PELVIS WITHOUT CONTRAST TECHNIQUE: Multidetector CT imaging of the abdomen and pelvis was performed following the standard protocol without IV contrast. Sagittal and coronal MPR images reconstructed from axial data set. Oral contrast not administered. COMPARISON:  None FINDINGS: Bibasilar atelectasis. Gallbladder surgically absent. Liver, spleen, pancreas, and RIGHT adrenal gland normal. Low-attenuation nodule LEFT adrenal gland 2.1 x 1.6 cm consistent with adrenal adenoma. Kidneys demonstrate BILATERAL hydronephrosis with high attenuation material within the renal collecting systems bilaterally, question residual of administered contrast for recent CTA exams. Significant perinephric fluid/edema bilaterally. On the LEFT, high attenuation material is seen dependently in the LEFT perinephric space along Zuckerkandl's fascia consistent with fornyceal and urine extravasation. Foley catheter and small amount of excreted contrast within urinary bladder. Marked prostatic enlargement, gland 7.2 x 5.8 x 7.3 cm. Additional high attenuation material is extraluminal to the bladder to the anterior RIGHT pelvis. This is remote from the RIGHT ureter and appears extraperitoneal. As this appears to be in the space of Retzius, possibility of bladder rupture is raised. No contrast extravasation in the RIGHT perinephric space is identified. Scattered low-attenuation ascites throughout abdomen and pelvis. BILATERAL inguinal hernias containing fat. Gastric wall appears thickened but stomach is under  distended, could potentially represent artifact. Bowel loops grossly unremarkable. Scattered atherosclerotic calcifications. No mass or adenopathy. Osseous structures unremarkable. IMPRESSION: Scattered  low-attenuation ascites. BILATERAL hydronephrosis and minimal ureteral dilatation with evidence of foreign axial rupture LEFT kidney and extravasation of opacified urine into the perinephric space. Additional extravasated contrast is seen extraperitoneal in the anterior RIGHT pelvis, located within the space of Retzius near urinary bladder, cannot exclude bladder rupture. Marked prostatic enlargement. BILATERAL inguinal hernias containing fat. Cannot exclude gastric wall thickening though this could be artifact from underdistention. LEFT adrenal adenoma. Findings called to Dr. Cinda Quest on 03/10/2015 at 2028 hours. Electronically Signed   By: Lavonia Dana M.D.   On: 03/10/2015 20:29   Ct Head Wo Contrast  03/10/2015  CLINICAL DATA:  Altered mental status.  Recent CVA. EXAM: CT HEAD WITHOUT CONTRAST TECHNIQUE: Contiguous axial images were obtained from the base of the skull through the vertex without intravenous contrast. COMPARISON:  Head CT 03/06/2015, brain MRI 03/07/2015 FINDINGS: No intracranial hemorrhage, mass effect, or midline shift. Generalized atrophy and periventricular white matter change, stable from recent prior exam. No hydrocephalus. The basilar cisterns are patent. No evidence of territorial infarct. No intracranial fluid collection. Calvarium is intact. Included paranasal sinuses and mastoid air cells are well aerated. IMPRESSION: Stable chronic change without acute intracranial abnormality. Electronically Signed   By: Jeb Levering M.D.   On: 03/10/2015 20:14   Dg Chest Portable 1 View  03/10/2015  CLINICAL DATA:  Urinary retention.  Hypotension. EXAM: PORTABLE CHEST 1 VIEW COMPARISON:  03/06/2015. FINDINGS: The heart size and mediastinal contours are within normal limits. Aortic atherosclerosis noted. Both lungs are clear. The visualized skeletal structures are unremarkable. IMPRESSION: No active disease. Electronically Signed   By: Kerby Moors M.D.   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 Came in with increasing difficulty urination found to have creatinine of 5.6. His last creatinine was 1.4 on 03/07/2015  1. Acute urinary retention- status post Foley. Secondary to BPH, Parkinson's, and possible TIA along with other medical comorbidities. Suspect retention is multifactorial.  -Seen by urology and Recommend maintaining Foley catheter for one to 2 weeks. -No Flomax or alpha blocker given patient's history of low blood pressure.  2. Acute renal failure-secondary to #1, improving.  -continue to monitor for postobstructive diuresis and electrolyte abnormalities.  3. History of Parkinson's disease with dementia   4. History of recent TIA continue aspirin  5. Leukocytosis- improving. No evidence of obvious UTI on UA. Follow-up urine culture and treat as needed. Afebrile.  Discussed at length with wife. Physical therapy to see patient. Management for discharge planning.  Case discussed with Care Management/Social Worker. Management plans discussed with the patient, family and they are in agreement.  CODE STATUS: DO NOT RESUSCITATE per wife  DVT Prophylaxis: Lovenox  TOTAL TIME TAKING CARE OF THIS PATIENT: 35* minutes.  >50% time spent on counselling and coordination of care  POSSIBLE D/C IN 1-2* DAYS, DEPENDING ON CLINICAL CONDITION.   Azadeh Hyder M.D on 03/11/2015 at 1:04 PM  Between 7am to 6pm - Pager - [phone]  After 6pm go to [email or web address] - password [email or web address] [facility]  Office  [phone]  CC: Primary care physician; Loistine Chance, MD

## 2015-03-11 NOTE — Progress Notes (Signed)
Pt has SCD ordered but is unable to tolerate at this time since he is confused and agitated. Per family they would like to leave SCD off but prefer some type of pharmacological prophylaxis for DVT prevention. MD note states that pt is on lovenox but it is not presently ordered. MD on call notified and Dr. Jannifer Franklin order subq lovenox

## 2015-03-12 ENCOUNTER — Telehealth: Payer: Self-pay | Admitting: Neurology

## 2015-03-12 DIAGNOSIS — N178 Other acute kidney failure: Secondary | ICD-10-CM

## 2015-03-12 LAB — URINE CULTURE
CULTURE: NO GROWTH
SPECIAL REQUESTS: NORMAL

## 2015-03-12 MED ORDER — CIPROFLOXACIN HCL 0.3 % OP SOLN
1.0000 [drp] | OPHTHALMIC | Status: DC
  Administered 2015-03-12 – 2015-03-13 (×6): 1 [drp] via OPHTHALMIC
  Filled 2015-03-12: qty 2.5

## 2015-03-12 MED ORDER — HALOPERIDOL LACTATE 5 MG/ML IJ SOLN: 5.0000 mg | Freq: Once | INTRAMUSCULAR | Status: DC

## 2015-03-12 NOTE — Care Management Important Message (Signed)
Important Message  Patient Details  Name: Shane Mooney. MRN: [id number] Date of Birth: Aug 10, 1940   Medicare Important Message Given:  Yes    Juliann Pulse A Nikolos Billig 03/12/2015, 1:26 PM

## 2015-03-12 NOTE — Telephone Encounter (Signed)
Spoke with patient's wife. He is in the hospital for bladder issues. Cancelled appt for tomorrow. Wanted to let Dr Tat know what is going on and that they gave him Halidol in the hospital and he slept all day and woke up very disoriented and agitated. They have him in mittens so he doesn't pull out his IV's and Catheter and he is very agitated. Just wanted to keep Dr Tat informed. They are trying to discharge him into a rehab center because he has to have a catheter for two weeks. They will call back to reschedule appt when he is better.

## 2015-03-12 NOTE — Progress Notes (Signed)
Dr. Lavetta Nielsen notified of patient's agitation and removal of foley; New orders written: reinsert foley and prn Haldol. Barbaraann Faster, RN; 12:20 AM;03/12/2015

## 2015-03-12 NOTE — Progress Notes (Signed)
[place] [facility] [place] NAME: Shane Mooney    MR#:  [id number]  DATE OF BIRTH:  Nov 05, 1940  SUBJECTIVE:   Awake this am. Eating breakfast fed by wife. Pulled foley out last nite. Re-inserted  REVIEW OF SYSTEMS:   Review of Systems  Unable to perform ROS: dementia   Tolerating PT; recommends rehab DRUG ALLERGIES:  No Known Allergies  VITALS:  Blood pressure 151/82, pulse 81, temperature 100.6 F (38.1 C), temperature source Axillary, resp. rate 18, height 5\' 6"  (1.676 m), weight 86.183 kg (190 lb), SpO2 97 %.  PHYSICAL EXAMINATION:   Physical Exam  GENERAL:  74 y.o.-year-old patient lying in the bed with no acute distress.  EYES: Pupils equal, round, reactive to light and accommodation. No scleral icterus. Extraocular muscles intact.  HEENT: Head atraumatic, normocephalic. Oropharynx and nasopharynx clear.  NECK:  Supple, no jugular venous distention. No thyroid enlargement, no tenderness.  LUNGS: Normal breath sounds bilaterally, no wheezing, rales, rhonchi. No use of accessory muscles of respiration.  CARDIOVASCULAR: S1, S2 normal. No murmurs, rubs, or gallops.  ABDOMEN: Soft, nontender, nondistended. Bowel sounds present. No organomegaly or mass.  EXTREMITIES: No cyanosis, clubbing or edema b/l.    NEUROLOGIC: Cranial nerves II through XII are intact. No focal Motor or sensory deficits b/l.   PSYCHIATRIC:  patient is alert and oriented x 3.  SKIN: No obvious rash, lesion, or ulcer.   LABORATORY PANEL:   CBC  Recent Labs Lab 03/11/15 0624  WBC 8.2  HGB 11.1*  HCT 32.5*  PLT 181    Chemistries   Recent Labs Lab 03/10/15 1818 03/11/15 0624  NA 136 140  K 3.9 3.8  CL 102 111  CO2 26 25  GLUCOSE 107* 89  BUN 50* 26*  CREATININE 5.35* 1.44*  CALCIUM 9.4 8.3*  AST 14*  --   ALT 6*  --   ALKPHOS 54  --   BILITOT 3.8*  --     Cardiac Enzymes  Recent Labs Lab 03/10/15 1818  TROPONINI <0.03     RADIOLOGY:  Ct Abdomen Pelvis Wo Contrast  03/10/2015  CLINICAL DATA:  Altered mental status, urinary retention, discharge 3 days ago for stroke, with acute renal failure today, abdominal distention and abdominal pain, history Parkinson's EXAM: CT ABDOMEN AND PELVIS WITHOUT CONTRAST TECHNIQUE: Multidetector CT imaging of the abdomen and pelvis was performed following the standard protocol without IV contrast. Sagittal and coronal MPR images reconstructed from axial data set. Oral contrast not administered. COMPARISON:  None FINDINGS: Bibasilar atelectasis. Gallbladder surgically absent. Liver, spleen, pancreas, and RIGHT adrenal gland normal. Low-attenuation nodule LEFT adrenal gland 2.1 x 1.6 cm consistent with adrenal adenoma. Kidneys demonstrate BILATERAL hydronephrosis with high attenuation material within the renal collecting systems bilaterally, question residual of administered contrast for recent CTA exams. Significant perinephric fluid/edema bilaterally. On the LEFT, high attenuation material is seen dependently in the LEFT perinephric space along Zuckerkandl's fascia consistent with fornyceal and urine extravasation. Foley catheter and small amount of excreted contrast within urinary bladder. Marked prostatic enlargement, gland 7.2 x 5.8 x 7.3 cm. Additional high attenuation material is extraluminal to the bladder to the anterior RIGHT pelvis. This is remote from the RIGHT ureter and appears extraperitoneal. As this appears to be in the space of Retzius, possibility of bladder rupture is raised. No contrast extravasation in the RIGHT perinephric space is identified. Scattered low-attenuation ascites throughout abdomen and pelvis. BILATERAL inguinal hernias containing fat. Gastric wall appears  thickened but stomach is under distended, could potentially represent artifact. Bowel loops grossly unremarkable. Scattered atherosclerotic calcifications. No mass or adenopathy. Osseous structures  unremarkable. IMPRESSION: Scattered low-attenuation ascites. BILATERAL hydronephrosis and minimal ureteral dilatation with evidence of foreign axial rupture LEFT kidney and extravasation of opacified urine into the perinephric space. Additional extravasated contrast is seen extraperitoneal in the anterior RIGHT pelvis, located within the space of Retzius near urinary bladder, cannot exclude bladder rupture. Marked prostatic enlargement. BILATERAL inguinal hernias containing fat. Cannot exclude gastric wall thickening though this could be artifact from underdistention. LEFT adrenal adenoma. Findings called to Dr. Cinda Quest on 03/10/2015 at 2028 hours. Electronically Signed   By: Lavonia Dana M.D.   On: 03/10/2015 20:29   Ct Head Wo Contrast  03/10/2015  CLINICAL DATA:  Altered mental status.  Recent CVA. EXAM: CT HEAD WITHOUT CONTRAST TECHNIQUE: Contiguous axial images were obtained from the base of the skull through the vertex without intravenous contrast. COMPARISON:  Head CT 03/06/2015, brain MRI 03/07/2015 FINDINGS: No intracranial hemorrhage, mass effect, or midline shift. Generalized atrophy and periventricular white matter change, stable from recent prior exam. No hydrocephalus. The basilar cisterns are patent. No evidence of territorial infarct. No intracranial fluid collection. Calvarium is intact. Included paranasal sinuses and mastoid air cells are well aerated. IMPRESSION: Stable chronic change without acute intracranial abnormality. Electronically Signed   By: Jeb Levering M.D.   On: 03/10/2015 20:14   Dg Chest Portable 1 View  03/10/2015  CLINICAL DATA:  Urinary retention.  Hypotension. EXAM: PORTABLE CHEST 1 VIEW COMPARISON:  03/06/2015. FINDINGS: The heart size and mediastinal contours are within normal limits. Aortic atherosclerosis noted. Both lungs are clear. The visualized skeletal structures are unremarkable. IMPRESSION: No active disease. Electronically Signed   By: Kerby Moors M.D.    On: 03/10/2015 19:17   ASSESSMENT AND PLAN:   74 year old male with Parkinson's with Lewy body dementia, BPH, possible recent TIA with acute urinary retention with associated acute renal failure. Came in with increasing difficulty urination found to have creatinine of 5.6. His last creatinine was 1.4 on 03/07/2015  1. Acute urinary retention- status post Foley. Secondary to BPH, Parkinson's, and possible TIA along with other medical comorbidities. Suspect retention is multifactorial.  -Seen by urology and Recommend maintaining Foley catheter for one to 2 weeks. -No Flomax or alpha blocker given patient's history of low blood pressure.  2. Acute renal failure-secondary to #1, improving.  -continue to monitor for postobstructive diuresis and electrolyte abnormalities.  3. History of Parkinson's disease with dementia   4. History of recent TIA continue aspirin  5. Leukocytosis- improving. No evidence of obvious UTI on UA. Follow-up urine culture and treat as needed. Afebrile.  Discussed at length with wife.  Physical therapy recommneds rehab, wife agreeable -CSW for discharge planning.  Case discussed with Care Management/Social Worker. Management plans discussed with the patient, family and they are in agreement.  CODE STATUS: DO NOT RESUSCITATE per wife  DVT Prophylaxis: Lovenox  TOTAL TIME TAKING CARE OF THIS PATIENT: 25 minutes.  >50% time spent on counselling and coordination of care  POSSIBLE D/C IN 1-2* DAYS, DEPENDING ON CLINICAL CONDITION.   Kyah Buesing M.D on 03/12/2015 at 11:17 AM  Between 7am to 6pm - Pager - [phone]  After 6pm go to [email or web address] - password [email or web address] [facility]  Office  [phone]  CC: Primary care physician; Loistine Chance, MD

## 2015-03-12 NOTE — Progress Notes (Signed)
03/12/2015 16:46  Attempted to give scheduled Carbidopa/Levidopa.  Pt was asleep and wife suggested I hold until he wakes.  Dola Argyle, RN

## 2015-03-12 NOTE — Evaluation (Signed)
Physical Therapy Evaluation Patient Details Name: Shane Mooney. MRN: [id number] DOB: 1940/07/16 Today's Date: 03/12/2015   History of Present Illness  Pt is admitted for ARF. Pt with history of Parkinson's and Lewy body dementia. Pt currently lives with his wife who provides 24/7 care.  Clinical Impression  Pt is a pleasant 74 year old male who was admitted for ARF. Pt performs bed mobility with mod assist, transfers with mod assist and rw, and ambulation with max assist +2 and rw. Pt demonstrates deficits with balance/mobility/strength. Pt very pleasant and agreeable to therapy. Due to poor balance, pt unsafe to ambulate to bathroom at this time and would use +2 assist for all mobility. Would benefit from skilled PT to address above deficits and promote optimal return to PLOF      Follow Up Recommendations SNF    Equipment Recommendations       Recommendations for Other Services       Precautions / Restrictions Precautions Precautions: Fall Restrictions Weight Bearing Restrictions: No      Mobility  Bed Mobility Overal bed mobility: Needs Assistance Bed Mobility: Supine to Sit     Supine to sit: Mod assist     General bed mobility comments: assist required for bed mobility with +2 assist required for sit->supine. Pt has difficulty with sequencing of tasks and needs heavy verbal/tactile cues. Once seated at EOB, pt able to maintain seated balance with supervision  Transfers Overall transfer level: Needs assistance Equipment used: Rolling walker (2 wheeled) Transfers: Sit to/from Stand Sit to Stand: Mod assist         General transfer comment: sit<>Stand performed several times with cues for big movement as well as therapist demonstration. Pt requires hand placement on rw prior to standing as well as assist to begin movement. Once standing, pt only able to tolerate standing for brief amount of time between each repetition of  standing.  Ambulation/Gait Ambulation/Gait assistance: Max assist;+2 physical assistance Ambulation Distance (Feet): 1 Feet Assistive device: Rolling walker (2 wheeled) Gait Pattern/deviations: Step-to pattern;Decreased weight shift to left;Leaning posteriorly;Narrow base of support     General Gait Details: ambulated to [facility] with heavy assist and R side leaning, unable to unweight R side. Manual cues needed for positioning feet and rw for transfer to [facility]. Additional person required for safety  Stairs            Wheelchair Mobility    Modified Rankin (Stroke Patients Only)       Balance Overall balance assessment: History of Falls;Needs assistance Sitting-balance support: Bilateral upper extremity supported Sitting balance-Leahy Scale: Fair     Standing balance support: Bilateral upper extremity supported Standing balance-Leahy Scale: Poor Standing balance comment: heavy R side standing                             Pertinent Vitals/Pain Pain Assessment: No/denies pain    Home Living Family/patient expects to be discharged to:: Private residence Living Arrangements: Spouse/significant other Available Help at Discharge: Family;Available 24 hours/day Type of Home: House Home Access: Stairs to enter Entrance Stairs-Rails:  (1 rail) Technical brewer of Steps: 2 Home Layout: One level Home Equipment: Walker - 2 wheels;Wheelchair - Banker      Prior Function Level of Independence: Independent with assistive device(s)               Hand Dominance        Extremity/Trunk Assessment   Upper Extremity  Assessment: Generalized weakness (grossly 3+/5)           Lower Extremity Assessment: Generalized weakness (grossly 3+/5)         Communication   Communication: HOH  Cognition Arousal/Alertness: Lethargic Behavior During Therapy: Restless Overall Cognitive Status: History of cognitive impairments - at baseline                       General Comments      Exercises Other Exercises Other Exercises: Pt assisted with max assist + 2 to [facility]. Pt requires heavy assist to prevent fall and cues for reaching back for seated surface. Pt then required assist for transfer back to bed.      Assessment/Plan    PT Assessment Patient needs continued PT services  PT Diagnosis Difficulty walking;Generalized weakness   PT Problem List Decreased strength;Decreased mobility  PT Treatment Interventions Gait training;Therapeutic exercise   PT Goals (Current goals can be found in the Care Plan section) Acute Rehab PT Goals Patient Stated Goal: to get stronger PT Goal Formulation: With patient Time For Goal Achievement: 03/26/15 Potential to Achieve Goals: Good    Frequency Min 2X/week   Barriers to discharge        Co-evaluation               End of Session Equipment Utilized During Treatment: Gait belt Activity Tolerance: Patient tolerated treatment well Patient left: in bed;with bed alarm set;with nursing/sitter in room;with family/visitor present Nurse Communication: Mobility status         Time: 1003-1041 PT Time Calculation (min) (ACUTE ONLY): 38 min   Charges:   PT Evaluation $Initial PT Evaluation Tier I: 1 Procedure PT Treatments $Therapeutic Activity: 23-37 mins   PT G Codes:        Nicholos Aloisi 04/11/15, 11:39 AM  Greggory Stallion, PT, DPT [phone]

## 2015-03-12 NOTE — Progress Notes (Signed)
Urology Consult Follow Up  Subjective: Self removed Foley overnight, replaced.  Sleeping this evening.    Anti-infectives: Anti-infectives    None      Current Facility-Administered Medications  Medication Dose Route Frequency Provider Last Rate Last Dose  . acetaminophen (TYLENOL) tablet 650 mg  650 mg Oral Q6H PRN Lytle Butte, MD       Or  . acetaminophen (TYLENOL) suppository 650 mg  650 mg Rectal Q6H PRN Lytle Butte, MD      . aspirin EC tablet 81 mg  81 mg Oral Daily Lytle Butte, MD   81 mg at 03/12/15 1105  . atorvastatin (LIPITOR) tablet 10 mg  10 mg Oral Daily Lytle Butte, MD   10 mg at 03/12/15 1050  . carbidopa-levodopa (SINEMET IR) 25-100 MG per tablet immediate release 0.5 tablet  0.5 tablet Oral TID Lytle Butte, MD   0.5 tablet at 03/12/15 1051  . cholecalciferol (VITAMIN D) tablet 2,000 Units  2,000 Units Oral Daily Lytle Butte, MD   2,000 Units at 03/12/15 1051  . ciprofloxacin (CILOXAN) 0.3 % ophthalmic solution 1 drop  1 drop Both Eyes Q4H while awake Fritzi Mandes, MD   1 drop at 03/12/15 1501  . enoxaparin (LOVENOX) injection 40 mg  40 mg Subcutaneous Q24H Lance Coon, MD   40 mg at 03/11/15 2344  . haloperidol lactate (HALDOL) injection 5 mg  5 mg Intravenous Once Lytle Butte, MD   5 mg at 03/12/15 0724  . morphine 2 MG/ML injection 2 mg  2 mg Intravenous Q4H PRN Lytle Butte, MD      . ondansetron [facility]) tablet 4 mg  4 mg Oral Q6H PRN Lytle Butte, MD       Or  . ondansetron [facility]) injection 4 mg  4 mg Intravenous Q6H PRN Lytle Butte, MD      . oxyCODONE (Oxy IR/ROXICODONE) immediate release tablet 5 mg  5 mg Oral Q4H PRN Lytle Butte, MD   5 mg at 03/10/15 2355  . QUEtiapine (SEROQUEL) tablet 12.5 mg  12.5 mg Oral QPC lunch Lytle Butte, MD   12.5 mg at 03/12/15 1315  . QUEtiapine (SEROQUEL) tablet 25 mg  25 mg Oral BID Lytle Butte, MD   25 mg at 03/12/15 1051  . rivastigmine (EXELON) 9.5 mg/24hr 9.5 mg  9.5 mg Transdermal Daily Fritzi Mandes,  MD   9.5 mg at 03/12/15 1052  . sodium chloride 0.9 % injection 3 mL  3 mL Intravenous Q12H Lytle Butte, MD   3 mL at 03/12/15 1051  . vitamin B-12 (CYANOCOBALAMIN) tablet 1,000 mcg  1,000 mcg Oral Daily Lytle Butte, MD   1,000 mcg at 03/12/15 1050     Objective: Vital signs in last 24 hours: Temp:  [98 F (36.7 C)-100.6 F (38.1 C)] 98.3 F (36.8 C) (12/19 1206) Pulse Rate:  [79-88] 83 (12/19 1207) Resp:  [17-18] 17 (12/19 1206) BP: (125-152)/(80-120) 125/83 mmHg (12/19 1207) SpO2:  [97 %-100 %] 97 % (12/19 1206)  Intake/Output from previous day: 12/18 0701 - 12/19 0700 In: 2355 [P.O.:240; I.V.:2115] Out: 3250 [Urine:3250] Intake/Output this shift: Total I/O In: 623.3 [P.O.:240; I.V.:383.3] Out: 2075 [Urine:2075]   Physical Exam  Constitutional: He is well-developed, well-nourished, and in no distress.  HENT:  Head: Normocephalic and atraumatic.  Pulmonary/Chest: Effort normal.  Abdominal: Soft.  Genitourinary: Penis normal.  Foley in place draining well  Neurological:  Sleeping  Skin: Skin is warm and dry.      Lab Results:   Recent Labs  03/10/15 1818 03/11/15 0624  WBC 12.8* 8.2  HGB 12.5* 11.1*  HCT 37.0* 32.5*  PLT 223 181   BMET  Recent Labs  03/10/15 1818 03/11/15 0624  NA 136 140  K 3.9 3.8  CL 102 111  CO2 26 25  GLUCOSE 107* 89  BUN 50* 26*  CREATININE 5.35* 1.44*  CALCIUM 9.4 8.3*   PT/INR No results for input(s): LABPROT, INR in the last 72 hours. ABG No results for input(s): PHART, HCO3 in the last 72 hours.  Invalid input(s): PCO2, PO2  Studies/Results: Ct Abdomen Pelvis Wo Contrast  03/10/2015  CLINICAL DATA:  Altered mental status, urinary retention, discharge 3 days ago for stroke, with acute renal failure today, abdominal distention and abdominal pain, history Parkinson's EXAM: CT ABDOMEN AND PELVIS WITHOUT CONTRAST TECHNIQUE: Multidetector CT imaging of the abdomen and pelvis was performed following the standard  protocol without IV contrast. Sagittal and coronal MPR images reconstructed from axial data set. Oral contrast not administered. COMPARISON:  None FINDINGS: Bibasilar atelectasis. Gallbladder surgically absent. Liver, spleen, pancreas, and RIGHT adrenal gland normal. Low-attenuation nodule LEFT adrenal gland 2.1 x 1.6 cm consistent with adrenal adenoma. Kidneys demonstrate BILATERAL hydronephrosis with high attenuation material within the renal collecting systems bilaterally, question residual of administered contrast for recent CTA exams. Significant perinephric fluid/edema bilaterally. On the LEFT, high attenuation material is seen dependently in the LEFT perinephric space along Zuckerkandl's fascia consistent with fornyceal and urine extravasation. Foley catheter and small amount of excreted contrast within urinary bladder. Marked prostatic enlargement, gland 7.2 x 5.8 x 7.3 cm. Additional high attenuation material is extraluminal to the bladder to the anterior RIGHT pelvis. This is remote from the RIGHT ureter and appears extraperitoneal. As this appears to be in the space of Retzius, possibility of bladder rupture is raised. No contrast extravasation in the RIGHT perinephric space is identified. Scattered low-attenuation ascites throughout abdomen and pelvis. BILATERAL inguinal hernias containing fat. Gastric wall appears thickened but stomach is under distended, could potentially represent artifact. Bowel loops grossly unremarkable. Scattered atherosclerotic calcifications. No mass or adenopathy. Osseous structures unremarkable. IMPRESSION: Scattered low-attenuation ascites. BILATERAL hydronephrosis and minimal ureteral dilatation with evidence of foreign axial rupture LEFT kidney and extravasation of opacified urine into the perinephric space. Additional extravasated contrast is seen extraperitoneal in the anterior RIGHT pelvis, located within the space of Retzius near urinary bladder, cannot exclude bladder  rupture. Marked prostatic enlargement. BILATERAL inguinal hernias containing fat. Cannot exclude gastric wall thickening though this could be artifact from underdistention. LEFT adrenal adenoma. Findings called to Dr. Cinda Quest on 03/10/2015 at 2028 hours. Electronically Signed   By: Lavonia Dana M.D.   On: 03/10/2015 20:29   Ct Head Wo Contrast  03/10/2015  CLINICAL DATA:  Altered mental status.  Recent CVA. EXAM: CT HEAD WITHOUT CONTRAST TECHNIQUE: Contiguous axial images were obtained from the base of the skull through the vertex without intravenous contrast. COMPARISON:  Head CT 03/06/2015, brain MRI 03/07/2015 FINDINGS: No intracranial hemorrhage, mass effect, or midline shift. Generalized atrophy and periventricular white matter change, stable from recent prior exam. No hydrocephalus. The basilar cisterns are patent. No evidence of territorial infarct. No intracranial fluid collection. Calvarium is intact. Included paranasal sinuses and mastoid air cells are well aerated. IMPRESSION: Stable chronic change without acute intracranial abnormality. Electronically Signed   By: Jeb Levering M.D.   On: 03/10/2015 20:14  Dg Chest Portable 1 View  03/10/2015  CLINICAL DATA:  Urinary retention.  Hypotension. EXAM: PORTABLE CHEST 1 VIEW COMPARISON:  03/06/2015. FINDINGS: The heart size and mediastinal contours are within normal limits. Aortic atherosclerosis noted. Both lungs are clear. The visualized skeletal structures are unremarkable. IMPRESSION: No active disease. Electronically Signed   By: Kerby Moors M.D.   On: 03/10/2015 19:17     Assessment: 74 year old male with Parkinson's with Lewy body dementia, BPH, possible recent TIA with acute urinary retention with associated acute renal failure. Creatinine has trended down remarkably since Foley catheter placement. CT scan shows sequela of bladder outlet obstruction including bilateral hydroureteronephrosis, extravasation of urine from kidneys  consistent with forniceal rupture and questionable extraperitoneal bladder rupture. Review of CT scan shows some contrast material residual from previous CTA scan in the space of Retzius. This could be retained urine and an anterior diverticulum versus small extraperitoneal bladder injury although no mechanism of injury which would be quite unusual.  1. Acute urinary retention- status post Foley. Secondary to BPH, Parkinson's, and possible TIA along with other medical comorbidities. Suspect retention is multifactorial. Recommend maintaining Foley catheter for one to 2 weeks. Hesitant to start any Flomax or alpha blocker given patient's history of low blood pressure.  2. Acute renal failure-secondary to #1, improving. UOP stabilized.  3. Perinephric fluid- forniceal rupture secondary to bladder outlet obstruction. Conservative management with decompression of collecting system. No further intervention needed.  4. Possible extra peritoneal bladder injury- not convincing on CT scan although possible. Treatment for this would be Foley catheter 1-2 weeks with a cystogram prior to Foley removal.  5. Leukocytosis- improving. No evidence of obvious UTI on UA. Cx negative.  Low grade temp    Plan: -no new recs other than consider adding abx if spikes again -secure Foley at all times to ensure minimal risk of self removal/ urethral trauma -Foley x 1-2 weeks, cystogram prior to removal  Urology to sign off, please page with questions or concerns    LOS: 2 days    Hollice Espy 03/12/2015

## 2015-03-12 NOTE — Telephone Encounter (Signed)
PT's wife [name] called and said PT was in the hospital and needs a call back/Dawn CB# [phone]

## 2015-03-12 NOTE — Clinical Social Work Note (Signed)
Clinical Social Work Assessment  Patient Details  Name: Shane Mooney. MRN: [id number] Date of Birth: 02/14/1941  Date of referral:  03/12/15               Reason for consult:  Facility Placement                Permission sought to share information with:   (patient with L.B. dementia) Permission granted to share information::     Name::        Agency::     Relationship::     Contact Information:     Housing/Transportation Living arrangements for the past 2 months:  Single Family Home Source of Information:  Patient Patient Interpreter Needed:  None Criminal Activity/Legal Involvement Pertinent to Current Situation/Hospitalization:  No - Comment as needed Significant Relationships:  Spouse Lives with:  Spouse Do you feel safe going back to the place where you live?  Yes Need for family participation in patient care:  Yes (Comment)  Care giving concerns:  Patient resides with his wife at home.   Social Worker assessment / plan:  PT has informed CSW that they would recommend str for patient. CSW spoke with patient's wife and she is agreeable and would prefer [place]. Patient's wife tells me that patient has been fighting Catering manager dementia for about 4 years and that it is difficult for her to watch patient decline as he has traveled all over the world. Patient's wife states patient is not a Animator and does not have any behaviors with his dementia but does tend to hallucinate. Patient's wife is heavily involved with his care and their daughter assists where needed as well.   Employment status:  Retired Forensic scientist:  Medicare PT Recommendations:  [place] / Referral to community resources:  [place]  Patient/Family's Response to care:  Patient's wife is pleased with patient's care here.  Patient/Family's Understanding of and Emotional Response to Diagnosis, Current Treatment, and Prognosis:  Patient's wife expressed  appreciation for CSW assistance.  Emotional Assessment Appearance:  Appears stated age Attitude/Demeanor/Rapport:   (sweet and cooperative) Affect (typically observed):  Calm Orientation:  Oriented to Self Alcohol / Substance use:  Not Applicable Psych involvement (Current and /or in the community):  No (Comment)  Discharge Needs  Concerns to be addressed:  Care Coordination Readmission within the last 30 days:  No Current discharge risk:  None Barriers to Discharge:  No Barriers Identified   Shela Leff, LCSW 03/12/2015, 11:20 AM

## 2015-03-12 NOTE — NC FL2 (Signed)
[place] LEVEL OF CARE SCREENING TOOL     IDENTIFICATION  Patient Name: Shane Mooney. Birthdate: [birth year] Sex: male Admission Date (Current Location): 03/10/2015  [facility] and [place] Number: Engineering geologist and Address:  [facility], [address] [place], [place] [address]      Provider Number: [id number]  Attending Physician Name and Address:  Fritzi Mandes, MD  Relative Name and Phone Number:       Current Level of Care: Hospital Recommended Level of Care: [place] Prior Approval Number:    Date Approved/Denied:   PASRR Number:    Discharge Plan: SNF    Current Diagnoses: Patient Active Problem List   Diagnosis Date Noted  . Urinary retention   . Acute renal failure (ARF) ([place]) 03/10/2015  . Diastolic dysfunction 0000000  . Carotid atherosclerosis 03/07/2015  . TIA (transient ischemic attack) 03/06/2015  . 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 Hallucination 05/09/2014  . Bilateral hearing loss 01/10/2014  . Parkinson's disease ([place]) 01/10/2014  . 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 Personal history of colonic polyps 09/05/2011    Orientation RESPIRATION BLADDER Height & Weight    Self  Normal Incontinent   190 lbs.  BEHAVIORAL SYMPTOMS/MOOD NEUROLOGICAL BOWEL NUTRITION STATUS   (none)  (none) Incontinent Diet  AMBULATORY STATUS COMMUNICATION OF NEEDS Skin   Limited Assist Verbally Normal                       Personal Care Assistance Level of Assistance  Bathing, Dressing, Feeding Bathing Assistance: Limited assistance Feeding assistance: Limited assistance Dressing Assistance: Limited assistance     Functional Limitations Info  Hearing, Sight Sight Info: Impaired Hearing Info: Impaired      SPECIAL CARE FACTORS FREQUENCY  PT (By licensed PT)                    Contractures Contractures Info: Not present    Additional Factors Info  Code Status, Allergies  Code Status Info: DNR Allergies Info: NKA           Current Medications (03/12/2015):  This is the current hospital active medication list Current Facility-Administered Medications  Medication Dose Route Frequency Provider Last Rate Last Dose  . acetaminophen (TYLENOL) tablet 650 mg  650 mg Oral Q6H PRN Lytle Butte, MD       Or  . acetaminophen (TYLENOL) suppository 650 mg  650 mg Rectal Q6H PRN Lytle Butte, MD      . aspirin EC tablet 81 mg  81 mg Oral Daily Lytle Butte, MD   81 mg at 03/12/15 1105  . atorvastatin (LIPITOR) tablet 10 mg  10 mg Oral Daily Lytle Butte, MD   10 mg at 03/12/15 1050  . carbidopa-levodopa (SINEMET IR) 25-100 MG per tablet immediate release 0.5 tablet  0.5 tablet Oral TID Lytle Butte, MD   0.5 tablet at 03/12/15 1051  . cholecalciferol (VITAMIN D) tablet 2,000 Units  2,000 Units Oral Daily Lytle Butte, MD   2,000 Units at 03/12/15 1051  . enoxaparin (LOVENOX) injection 40 mg  40 mg Subcutaneous Q24H Lance Coon, MD   40 mg at 03/11/15 2344  . haloperidol lactate (HALDOL) injection 5 mg  5 mg Intravenous Once Lytle Butte, MD   5 mg at 03/12/15 0724  . morphine 2 MG/ML injection 2 mg  2 mg Intravenous Q4H PRN Lytle Butte, MD      .  ondansetron (ZOFRAN) tablet 4 mg  4 mg Oral Q6H PRN Lytle Butte, MD       Or  . ondansetron [facility]) injection 4 mg  4 mg Intravenous Q6H PRN Lytle Butte, MD      . oxyCODONE (Oxy IR/ROXICODONE) immediate release tablet 5 mg  5 mg Oral Q4H PRN Lytle Butte, MD   5 mg at 03/10/15 2355  . QUEtiapine (SEROQUEL) tablet 12.5 mg  12.5 mg Oral QPC lunch Lytle Butte, MD   12.5 mg at 03/11/15 1504  . QUEtiapine (SEROQUEL) tablet 25 mg  25 mg Oral BID Lytle Butte, MD   25 mg at 03/12/15 1051  . rivastigmine (EXELON) 9.5 mg/24hr 9.5 mg  9.5 mg Transdermal Daily Fritzi Mandes, MD   9.5 mg at 03/12/15 1052  . sodium chloride 0.9 % injection 3 mL  3 mL Intravenous Q12H Lytle Butte, MD   3 mL at 03/12/15 1051  . vitamin B-12  (CYANOCOBALAMIN) tablet 1,000 mcg  1,000 mcg Oral Daily Lytle Butte, MD   1,000 mcg at 03/12/15 1050     Discharge Medications: Please see discharge summary for a list of discharge medications.  Relevant Imaging Results:  Relevant Lab Results:   Additional Information SS: [id number]; Has lewey body dementia but has no behaviors, will hallucinate (reaching for things in air)  Shela Leff, LCSW

## 2015-03-12 NOTE — Progress Notes (Signed)
Initial Nutrition Assessment     INTERVENTION:  Meals and snacks: Cater to pt preferences Medical Nutrition Supplement therapy: If unable to meet nutritional needs will add supplement  NUTRITION DIAGNOSIS:   Increased nutrient needs (none at this time) related to acute illness as evidenced by estimated needs.    GOAL:   Patient will meet greater than or equal to 90% of their needs    MONITOR:    (Energy intake)  REASON FOR ASSESSMENT:   Diagnosis    ASSESSMENT:    Pt admitted with ARF, urinary retention  Past Medical History  Diagnosis Date  . Ulcer   . Hernia     LIH  . 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 Cancer ([place]) 2013    basal cell carcinoma back and face  . Personal history of colonic polyps   . Parkinson disease [facility]) April 2014  . Dementia     Current Nutrition: eating mostly 100% of meals per wife and tolerating well  Food/Nutrition-Related History: Wife reports good appetite at home, eats 3 meals per day with snacks.     Scheduled Medications:  Vit D and B12    Electrolyte/Renal Profile and Glucose Profile:   Recent Labs Lab 03/07/15 0456 03/10/15 1818 03/11/15 0624  NA 142 136 140  K 3.6 3.9 3.8  CL 107 102 111  CO2 28 26 25   BUN 19 50* 26*  CREATININE 1.26* 5.35* 1.44*  CALCIUM 9.2 9.4 8.3*  GLUCOSE 117* 107* 89   Protein Profile:   Recent Labs Lab 03/10/15 1818  ALBUMIN 3.6    Gastrointestinal Profile: Last BM:12/14   Nutrition-Focused Physical Exam Findings: Nutrition-Focused physical exam completed. Findings are normal fat depletion, mild/moderate muscle depletion, and no edema.     Weight Change: per wt encounter wt gain, unsure accuracy of wt reading on 12/17.  Per wife pt has lost about 10 pounds in the last 6 weeks (6% wt loss in the last 9 months  Wt Readings from Last 10 Encounters:  03/10/15 190 lb (86.183 kg)  03/07/15 161 lb 4.8 oz (73.165 kg)  09/07/14 165 lb (74.844 kg)  07/26/14  164 lb (74.39 kg)  06/05/14 172 lb (78.019 kg)  12/07/12 179 lb (81.194 kg)      Diet Order:  Diet regular Room service appropriate?: Yes; Fluid consistency:: Thin  Skin:   reviewed   Height:   Ht Readings from Last 1 Encounters:  03/10/15 5\' 6"  (1.676 m)    Weight:   Wt Readings from Last 1 Encounters:  03/10/15 190 lb (86.183 kg)    Ideal Body Weight:     BMI:  Body mass index is 30.68 kg/(m^2).  Estimated Nutritional Needs:   Kcal:  BEE 1547 kcals (IF 1.0-1.2, AF 1.3) [id number] kcals/d.   Protein:  (1.0-1.2 g/kg) 86-103 g/d  Fluid:  (25-57ml/kg) 2150-2553ml/d  EDUCATION NEEDS:   No education needs identified at this time  [place]. Zenia Resides, [place], [place] (pager)

## 2015-03-13 ENCOUNTER — Ambulatory Visit: Payer: Medicare Other | Admitting: Podiatry

## 2015-03-13 ENCOUNTER — Ambulatory Visit: Payer: Medicare Other | Admitting: Neurology

## 2015-03-13 MED ORDER — CIPROFLOXACIN HCL 0.3 % OP SOLN: 1.0000 [drp] | OPHTHALMIC | Status: DC

## 2015-03-13 MED ORDER — FLEET ENEMA 7-19 GM/118ML RE ENEM
1.0000 | ENEMA | Freq: Once | RECTAL | Status: AC
  Administered 2015-03-13: 1 via RECTAL

## 2015-03-13 NOTE — Discharge Summary (Signed)
[place] at [place] NAME: Shane Mooney    MR#:  [id number]  DATE OF BIRTH:  1940/08/01  DATE OF ADMISSION:  03/10/2015 ADMITTING PHYSICIAN: Lytle Butte, MD  DATE OF DISCHARGE: 03/13/15  PRIMARY CARE PHYSICIAN: Loistine Chance, MD    ADMISSION DIAGNOSIS:  Abdominal pain [R10.9] Urinary tract infection with hematuria, site unspecified [N39.0, R31.9] Acute renal failure, unspecified acute renal failure type ([place]) [N17.9] Altered mental status, unspecified altered mental status type [R41.82]  DISCHARGE DIAGNOSIS:   Acute obstructive uropathy status with acute urinary retention status post Foley catheter placement. Further workup per urology as outpatient  Parkinson's disease with dementia  SECONDARY . DIAGNOSIS:   Past Medical History  Diagnosis Date  . Ulcer   . Hernia     LIH  . 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 Cancer ([place]) 2013    basal cell carcinoma back and face  . Personal history of colonic polyps   . Parkinson disease [facility]) April 2014  . Dementia     HOSPITAL COURSE:   74 year old male with Parkinson's with Lewy body dementia, BPH, possible recent TIA with acute urinary retention with associated acute renal failure. Came in with increasing difficulty urination found to have creatinine of 5.6. His last creatinine was 1.4 on 03/07/2015  1. Acute urinary retention- status post Foley. Secondary to BPH, Parkinson's, and possible TIA along with other medical comorbidities. Suspect retention is multifactorial.  -Seen by urology and Recommend maintaining Foley catheter for one to 2 weeks. -No Flomax or alpha blocker given patient's history of low blood pressure. - we'll get patient to see Dr. Erlene Quan as outpatient in 2 weeks.   2. Acute renal failure-secondary to #1, improving.  -continue to monitor for postobstructive diuresis and electrolyte abnormalities.  3. History of Parkinson's  disease with dementia   4. History of recent TIA continue aspirin  5. Leukocytosis- improving. No evidence of obvious UTI on UA. Follow-up urine culture and treat as needed. Afebrile.  Patient was evaluated by physical therapy recommends rehabilitation. He has a better twin [place] will discharge him later this afternoon.  Discussed with wife agreeable.  CONSULTS OBTAINED:  Treatment Team:  Lytle Butte, MD Hollice Espy, MD  DRUG ALLERGIES:  No Known Allergies  DISCHARGE MEDICATIONS:   Current Discharge Medication List    START taking these medications   Details  ciprofloxacin (CILOXAN) 0.3 % ophthalmic solution Place 1 drop into both eyes every 4 (four) hours while awake. Administer 1 drop, every 2 hours, while awake, for 2 days. Then 1 drop, every 4 hours, while awake, for the next 5 days. Qty: 5 mL, Refills: 0      CONTINUE these medications which have NOT CHANGED   Details  aspirin EC 81 MG EC tablet Take 1 tablet (81 mg total) by mouth daily. Qty: 120 tablet, Refills: 0    atorvastatin (LIPITOR) 10 MG tablet Take 1 tablet (10 mg total) by mouth daily. Qty: 30 tablet, Refills: 0    carbidopa-levodopa (SINEMET IR) 25-100 MG tablet Take 0.5 tablets by mouth 3 (three) times daily. Qty: 45 tablet, Refills: 5    Cholecalciferol (VITAMIN D PO) Take 2,000 Units by mouth daily.    Cyanocobalamin (VITAMIN B-12 PO) Take 1,000 mcg by mouth daily.    Melatonin 3 MG CAPS Take 1 capsule by mouth at bedtime.     QUEtiapine (SEROQUEL) 25 MG tablet 1 tablet in the morning, 1/2  tablet in the afternoon, 1 at night. Patient can take an extra 1/2 tablet daily as needed Qty: 90 tablet, Refills: 5    rivastigmine (EXELON) 9.5 mg/24hr Place 1 patch (9.5 mg total) onto the skin daily. Qty: 30 patch, Refills: 5        If you experience worsening of your admission symptoms, develop shortness of breath, life threatening emergency, suicidal or homicidal thoughts you must seek medical  attention immediately by calling 911 or calling your MD immediately  if symptoms less severe.  You Must read complete instructions/literature along with all the possible adverse reactions/side effects for all the Medicines you take and that have been prescribed to you. Take any new Medicines after you have completely understood and accept all the possible adverse reactions/side effects.   Please note  You were cared for by a hospitalist during your hospital stay. If you have any questions about your discharge medications or the care you received while you were in the hospital after you are discharged, you can call the unit and asked to speak with the hospitalist on call if the hospitalist that took care of you is not available. Once you are discharged, your primary care physician will handle any further medical issues. Please note that NO REFILLS for any discharge medications will be authorized once you are discharged, as it is imperative that you return to your primary care physician (or establish a relationship with a primary care physician if you do not have one) for your aftercare needs so that they can reassess your need for medications and monitor your lab values. Today   SUBJECTIVE    eating lunch  Per wife patient much calmer today.   VITAL SIGNS:  Blood pressure 146/76, pulse 70, temperature 97.8 F (36.6 C), temperature source Axillary, resp. rate 18, height 5\' 6"  (1.676 m), weight 86.183 kg (190 lb), SpO2 97 %.  I/O:   Intake/Output Summary (Last 24 hours) at 03/13/15 1211 Last data filed at 03/13/15 1151  Gross per 24 hour  Intake    240 ml  Output   2125 ml  Net  -1885 ml    PHYSICAL EXAMINATION:  GENERAL:  74 y.o.-year-old patient lying in the bed with no acute distress.  EYES: Pupils equal, round, reactive to light and accommodation. No scleral icterus. Extraocular muscles intact.  HEENT: Head atraumatic, normocephalic. Oropharynx and nasopharynx clear.  NECK:  Supple,  no jugular venous distention. No thyroid enlargement, no tenderness.  LUNGS: Normal breath sounds bilaterally, no wheezing, rales,rhonchi or crepitation. No use of accessory muscles of respiration.  CARDIOVASCULAR: S1, S2 normal. No murmurs, rubs, or gallops.  ABDOMEN: Soft, non-tender, non-distended. Bowel sounds present. No organomegaly or mass.  Foley catheter  EXTREMITIES: No pedal edema, cyanosis, or clubbing.  NEUROLOGIC: Cranial nerves II through XII are intact. Muscle strength 5/5 in all extremities. Sensation intact. Gait not checked.  PSYCHIATRIC: patient is alert .  SKIN: No obvious rash, lesion, or ulcer.   DATA REVIEW:   CBC   Recent Labs Lab 03/11/15 0624  WBC 8.2  HGB 11.1*  HCT 32.5*  PLT 181    Chemistries   Recent Labs Lab 03/10/15 1818 03/11/15 0624  NA 136 140  K 3.9 3.8  CL 102 111  CO2 26 25  GLUCOSE 107* 89  BUN 50* 26*  CREATININE 5.35* 1.44*  CALCIUM 9.4 8.3*  AST 14*  --   ALT 6*  --   ALKPHOS 54  --   BILITOT 3.8*  --  Microbiology Results   Recent Results (from the past 240 hour(s))  Urine culture     Status: None   Collection Time: 03/10/15  6:18 PM  Result Value Ref Range Status   Specimen Description URINE, RANDOM  Final   Special Requests Normal  Final   Culture NO GROWTH 2 DAYS  Final   Report Status 03/12/2015 FINAL  Final    RADIOLOGY:  No results found.   Management plans discussed with the patient, family and they are in agreement.  CODE STATUS:     Code Status Orders        Start     Ordered   03/11/15 1320  Do not attempt resuscitation (DNR)   Continuous    Question Answer Comment  In the event of cardiac or respiratory ARREST Do not call a "code blue"   In the event of cardiac or respiratory ARREST Do not perform Intubation, CPR, defibrillation or ACLS   In the event of cardiac or respiratory ARREST Use medication by any route, position, wound care, and other measures to relive pain and suffering. May  use oxygen, suction and manual treatment of airway obstruction as needed for comfort.      03/11/15 1319    Advance Directive Documentation        Most Recent Value   Type of Advance Directive  Healthcare Power of Attorney   Pre-existing out of facility DNR order (yellow form or pink MOST form)     "MOST" Form in Place?        TOTAL TIME TAKING CARE OF THIS PATIENT: 40 minutes.    Rayman Petrosian M.D on 03/13/2015 at 12:11 PM  Between 7am to 6pm - Pager - [phone] After 6pm go to [email or web address] - password [email or web address] [facility] [facility]  Office  [phone]  CC: Primary care physician; Loistine Chance, MD

## 2015-03-13 NOTE — Clinical Social Work Note (Signed)
Patient has received multiple bed offers and wife has chosen the private room at [facility]. CSW will facilitate discharge when time. Shela Leff MSW,LCSW [phone]

## 2015-03-13 NOTE — Clinical Social Work Placement (Signed)
   CLINICAL SOCIAL WORK PLACEMENT  NOTE  Date:  03/13/2015  Patient Details  Name: Shane Mooney. MRN: [id number] Date of Birth: 1940/07/27  Clinical Social Work is seeking post-discharge placement for this patient at the [place] level of care (*CSW will initial, date and re-position this form in  chart as items are completed):  Yes   Patient/family provided with [place] Work Department's list of facilities offering this level of care within the geographic area requested by the patient (or if unable, by the patient's family).  Yes   Patient/family informed of their freedom to choose among providers that offer the needed level of care, that participate in Medicare, Medicaid or managed care program needed by the patient, have an available bed and are willing to accept the patient.      Patient/family informed of [place]'s ownership interest in [facility] and [facility], as well as of the fact that they are under no obligation to receive care at these facilities.  PASRR submitted to EDS on 03/12/15     PASRR number received on 03/12/15     Existing PASRR number confirmed on       FL2 transmitted to all facilities in geographic area requested by pt/family on 03/12/15     FL2 transmitted to all facilities within larger geographic area on       Patient informed that his/her managed care company has contracts with or will negotiate with certain facilities, including the following:        Yes   Patient/family informed of bed offers received.  Patient chooses bed at  [facility])     Physician recommends and patient chooses bed at  [facility])    Patient to be transferred to  [facility]) on 03/13/15.  Patient to be transferred to facility by  (EMS)     Patient family notified on   of transfer.  Name of family member notified:   (patient's wife)     PHYSICIAN       Additional Comment:     _______________________________________________ Shela Leff, LCSW 03/13/2015, 2:00 PM

## 2015-03-13 NOTE — Progress Notes (Signed)
Physical Therapy Treatment Patient Details Name: Shane Mooney. MRN: [id number] DOB: 01-10-1941 Today's Date: 03/13/2015    History of Present Illness Pt is admitted for ARF. Pt with history of Parkinson's and Lewy body dementia. Pt currently lives with his wife who provides 24/7 care.    PT Comments    Seen per RN request for assisted transfer to [facility] (to facilitate BM).  Requires +2 assist for all standing and transfer attempts.  Poor standing balance; poor righting reactions.   Follow Up Recommendations  SNF     Equipment Recommendations       Recommendations for Other Services       Precautions / Restrictions Precautions Precautions: Fall Restrictions Weight Bearing Restrictions: No    Mobility  Bed Mobility Overal bed mobility: Needs Assistance Bed Mobility: Supine to Sit;Sit to Supine     Supine to sit: Mod assist;Max assist Sit to supine: Max assist;Total assist   General bed mobility comments: hand-over-hand guidance for task initiation.  Poor dissociation of trunk/extremities with all movement transitions  Transfers Overall transfer level: Needs assistance Equipment used: Rolling walker (2 wheeled) Transfers: Sit to/from Stand Sit to Stand: Mod assist;Max assist         General transfer comment: assist for forward weight shift, lift off, postural extension and standing balance  Ambulation/Gait Ambulation/Gait assistance: Mod assist;Max assist;+2 physical assistance Ambulation Distance (Feet): 4 Feet Assistive device: Rolling walker (2 wheeled)       General Gait Details: R lateral lean, poor dynamic weight shifting.  Difficulty advancing R > L LE with transfer.  Constant cuing for postural extension and standing balance.   Stairs            Wheelchair Mobility    Modified Rankin (Stroke Patients Only)       Balance Overall balance assessment: Needs assistance Sitting-balance support: No upper extremity supported;Feet  supported Sitting balance-Leahy Scale: Fair     Standing balance support: Bilateral upper extremity supported Standing balance-Leahy Scale: Poor                      Cognition Arousal/Alertness: Awake/alert Behavior During Therapy: WFL for tasks assessed/performed Overall Cognitive Status: History of cognitive impairments - at baseline                      Exercises Other Exercises Other Exercises: Toilet transfer, SPT with RW, max assist +2 for safety.  Poor weight shifting, dynamic balance; constant tactile facilitation to unweight/advance appropriate LE.  R lateral lean throughout.  Dep for hygiene in both sitting and standing.    General Comments        Pertinent Vitals/Pain Pain Assessment: No/denies pain    Home Living                      Prior Function            PT Goals (current goals can now be found in the care plan section) Acute Rehab PT Goals Patient Stated Goal: to get stronger PT Goal Formulation: With patient Time For Goal Achievement: 03/26/15 Potential to Achieve Goals: Good Progress towards PT goals: Progressing toward goals    Frequency  Min 2X/week    PT Plan Current plan remains appropriate    Co-evaluation             End of Session Equipment Utilized During Treatment: Gait belt Activity Tolerance: Patient tolerated treatment well Patient left: in bed;with  nursing/sitter in room;with family/visitor present;with call bell/phone within reach (nursing to complete discharge prep; to connect bed alarm once complete)     Time: 1407-1430 PT Time Calculation (min) (ACUTE ONLY): 23 min  Charges:  $Therapeutic Activity: 23-37 mins                    G Codes:       Negar Sieler H. Owens Shark, PT, DPT, NCS 03/13/2015, 3:33 PM [phone]

## 2015-03-13 NOTE — Clinical Social Work Note (Signed)
Patient to discharge to [facility] today. Discharge information sent and nurse to call report. Patient's wife requesting EMS transport. Shela Leff MSW,LCSW [phone]

## 2015-03-13 NOTE — Progress Notes (Signed)
IV and tele were removed. Report called to Mordecai Rasmussen at [facility]. Will call EMS and await arrival. Wife and daughter at bedside and updated.

## 2015-03-15 DIAGNOSIS — R410 Disorientation, unspecified: Secondary | ICD-10-CM | POA: Diagnosis not present

## 2015-03-15 DIAGNOSIS — G3183 Dementia with Lewy bodies: Secondary | ICD-10-CM

## 2015-03-15 DIAGNOSIS — N139 Obstructive and reflux uropathy, unspecified: Secondary | ICD-10-CM | POA: Diagnosis not present

## 2015-03-15 DIAGNOSIS — G20 Parkinson's disease: Secondary | ICD-10-CM | POA: Diagnosis not present

## 2015-03-15 DIAGNOSIS — R441 Visual hallucinations: Secondary | ICD-10-CM

## 2015-03-20 DIAGNOSIS — L219 Seborrheic dermatitis, unspecified: Secondary | ICD-10-CM | POA: Diagnosis not present

## 2015-03-21 ENCOUNTER — Ambulatory Visit: Payer: Medicare Other | Admitting: Family Medicine

## 2015-03-23 ENCOUNTER — Ambulatory Visit: Payer: Self-pay | Admitting: Family Medicine

## 2015-03-27 DIAGNOSIS — R4182 Altered mental status, unspecified: Secondary | ICD-10-CM | POA: Diagnosis not present

## 2015-03-30 ENCOUNTER — Ambulatory Visit: Payer: Medicare Other | Admitting: Family Medicine

## 2015-04-06 ENCOUNTER — Ambulatory Visit (INDEPENDENT_AMBULATORY_CARE_PROVIDER_SITE_OTHER): Payer: Medicare Other | Admitting: Urology

## 2015-04-06 ENCOUNTER — Ambulatory Visit
Admission: RE | Admit: 2015-04-06 | Discharge: 2015-04-06 | Disposition: A | Discharge status: Home / Self Care | Payer: Medicare Other | Source: Ambulatory Visit | Attending: Urology | Admitting: Urology

## 2015-04-06 ENCOUNTER — Encounter: Payer: Self-pay | Admitting: Urology

## 2015-04-06 VITALS — BP 89/56 | HR 70 | Ht 70.0 in | Wt 170.0 lb

## 2015-04-06 DIAGNOSIS — S3720XD Unspecified injury of bladder, subsequent encounter: Secondary | ICD-10-CM

## 2015-04-06 DIAGNOSIS — R339 Retention of urine, unspecified: Secondary | ICD-10-CM | POA: Diagnosis present

## 2015-04-06 DIAGNOSIS — N1330 Unspecified hydronephrosis: Secondary | ICD-10-CM

## 2015-04-06 MED ORDER — TAMSULOSIN HCL 0.4 MG PO CAPS: 0.4000 mg | ORAL_CAPSULE | Freq: Every day | ORAL | Status: AC

## 2015-04-06 MED ORDER — IOTHALAMATE MEGLUMINE 17.2 % UR SOLN: 375.0000 mL | Freq: Once | URETHRAL | Status: DC | PRN

## 2015-04-06 NOTE — Progress Notes (Signed)
Fill and Pull Catheter Removal  Patient is present today for a catheter removal.  Patient was cleaned and prepped in a sterile fashion 470ml of sterile water/ saline was instilled into the bladder when the patient felt the urge to urinate. 7ml of water was then drained from the balloon.  A 16FR foley cath was removed from the bladder no complications were noted .  Patient as then given some time to void on their own.  Patient cannot void.  Patient tolerated well.  Preformed by: Judson Roch Daveon Arpino, CMA  Simple Catheter Placement  Due to urinary retention patient is present today for a foley cath placement.  Patient was cleaned and prepped in a sterile fashion with betadine and lidocaine jelly 2% was instilled into the urethra.  A 16 FR foley catheter was inserted, urine return was noted  243ml, urine was clear yellow in color.  The balloon was filled with 10cc of sterile water.  A leg bag was attached for drainage. Patient was also given a night bag to take home and was given instruction on how to change from one bag to another.  Patient was given instruction on proper catheter care.  Patient tolerated well, no complications were noted   Preformed by: Fonnie Jarvis, CMA  Additional notes/ Follow up: 2 weeks for a voiding trial and renal u/s results

## 2015-04-07 ENCOUNTER — Emergency Department
Admission: EM | Admit: 2015-04-07 | Discharge: 2015-04-07 | Disposition: A | Discharge status: Home / Self Care | Payer: Medicare Other | Attending: Emergency Medicine | Admitting: Emergency Medicine

## 2015-04-07 ENCOUNTER — Encounter: Payer: Self-pay | Admitting: Emergency Medicine

## 2015-04-07 DIAGNOSIS — Z87891 Personal history of nicotine dependence: Secondary | ICD-10-CM | POA: Diagnosis not present

## 2015-04-07 DIAGNOSIS — G3183 Dementia with Lewy bodies: Secondary | ICD-10-CM | POA: Diagnosis not present

## 2015-04-07 DIAGNOSIS — T83098S Other mechanical complication of other indwelling urethral catheter, sequela: Secondary | ICD-10-CM | POA: Diagnosis not present

## 2015-04-07 DIAGNOSIS — T839XXS Unspecified complication of genitourinary prosthetic device, implant and graft, sequela: Secondary | ICD-10-CM

## 2015-04-07 DIAGNOSIS — Y658 Other specified misadventures during surgical and medical care: Secondary | ICD-10-CM | POA: Insufficient documentation

## 2015-04-07 MED ORDER — TRAZODONE HCL 100 MG PO TABS: 100.0000 mg | ORAL_TABLET | Freq: Every day | ORAL | Status: DC

## 2015-04-07 MED ORDER — SULFAMETHOXAZOLE-TRIMETHOPRIM 800-160 MG PO TABS: 1.0000 | ORAL_TABLET | Freq: Two times a day (BID) | ORAL | Status: AC

## 2015-04-07 NOTE — ED Notes (Signed)
Patient brought in from home because he pulled his urinary catheter out. EMS stated that patients wife did not want to have the catheter put back in until we spoke with patients urologist.

## 2015-04-07 NOTE — Discharge Instructions (Signed)
Foley Catheter Care, Adult °A Foley catheter is a soft, flexible tube that is placed into the bladder to drain urine. A Foley catheter may be inserted if: °· You leak urine or are not able to control when you urinate (urinary incontinence). °· You are not able to urinate when you need to (urinary retention). °· You had prostate surgery or surgery on the genitals. °· You have certain medical conditions, such as multiple sclerosis, dementia, or a spinal cord injury. °If you are going home with a Foley catheter in place, follow the instructions below. °TAKING CARE OF THE CATHETER °1. Wash your hands with soap and water. °2. Using mild soap and warm water on a clean washcloth: °¨ Clean the area on your body closest to the catheter insertion site using a circular motion, moving away from the catheter. Never wipe toward the catheter because this could sweep bacteria up into the urethra and cause infection. °¨ Remove all traces of soap. Pat the area dry with a clean towel. For males, reposition the foreskin. °3. Attach the catheter to your leg so there is no tension on the catheter. Use adhesive tape or a leg strap. If you are using adhesive tape, remove any sticky residue left behind by the previous tape you used. °4. Keep the drainage bag below the level of the bladder, but keep it off the floor. °5. Check throughout the day to be sure the catheter is working and urine is draining freely. Make sure the tubing does not become kinked. °6. Do not pull on the catheter or try to remove it. Pulling could damage internal tissues. °TAKING CARE OF THE DRAINAGE BAGS °You will be given two drainage bags to take home. One is a large overnight drainage bag, and the other is a smaller leg bag that fits underneath clothing. You may wear the overnight bag at any time, but you should never wear the smaller leg bag at night. Follow the instructions below for how to empty, change, and clean your drainage bags. °Emptying the Drainage  Bag °You must empty your drainage bag when it is  -½ full or at least 2-3 times a day. °1. Wash your hands with soap and water. °2. Keep the drainage bag below your hips, below the level of your bladder. This stops urine from going back into the tubing and into your bladder. °3. Hold the dirty bag over the toilet or a clean container. °4. Open the pour spout at the bottom of the bag and empty the urine into the toilet or container. Do not let the pour spout touch the toilet, container, or any other surface. Doing so can place bacteria on the bag, which can cause an infection. °5. Clean the pour spout with a gauze pad or cotton ball that has rubbing alcohol on it. °6. Close the pour spout. °7. Attach the bag to your leg with adhesive tape or a leg strap. °8. Wash your hands well. °Changing the Drainage Bag °Change your drainage bag once a month or sooner if it starts to smell bad or look dirty. Below are steps to follow when changing the drainage bag. °1. Wash your hands with soap and water. °2. Pinch off the rubber catheter so that urine does not spill out. °3. Disconnect the catheter tube from the drainage tube at the connection valve. Do not let the tubes touch any surface. °4. Clean the end of the catheter tube with an alcohol wipe. Use a different alcohol wipe to clean   the end of the drainage tube. °5. Connect the catheter tube to the drainage tube of the clean drainage bag. °6. Attach the new bag to the leg with adhesive tape or a leg strap. Avoid attaching the new bag too tightly. °7. Wash your hands well. °Cleaning the Drainage Bag °1. Wash your hands with soap and water. °2. Wash the bag in warm, soapy water. °3. Rinse the bag thoroughly with warm water. °4. Fill the bag with a solution of white vinegar and water (1 cup vinegar to 1 qt warm water [.2 L vinegar to 1 L warm water]). Close the bag and soak it for 30 minutes in the solution. °5. Rinse the bag with warm water. °6. Hang the bag to dry with the  pour spout open and hanging downward. °7. Store the clean bag (once it is dry) in a clean plastic bag. °8. Wash your hands well. °PREVENTING INFECTION °· Wash your hands before and after handling your catheter. °· Take showers daily and wash the area where the catheter enters your body. Do not take baths. Replace wet leg straps with dry ones, if this applies. °· Do not use powders, sprays, or lotions on the genital area. Only use creams, lotions, or ointments as directed by your caregiver. °· For females, wipe from front to back after each bowel movement. °· Drink enough fluids to keep your urine clear or pale yellow unless you have a fluid restriction. °· Do not let the drainage bag or tubing touch or lie on the floor. °· Wear cotton underwear to absorb moisture and to keep your skin drier. °SEEK MEDICAL CARE IF:  °· Your urine is cloudy or smells unusually bad. °· Your catheter becomes clogged. °· You are not draining urine into the bag or your bladder feels full. °· Your catheter starts to leak. °SEEK IMMEDIATE MEDICAL CARE IF:  °· You have pain, swelling, redness, or pus where the catheter enters the body. °· You have pain in the abdomen, legs, lower back, or bladder. °· You have a fever. °· You see blood fill the catheter, or your urine is pink or red. °· You have nausea, vomiting, or chills. °· Your catheter gets pulled out. °MAKE SURE YOU:  °· Understand these instructions. °· Will watch your condition. °· Will get help right away if you are not doing well or get worse. °  °This information is not intended to replace advice given to you by your health care provider. Make sure you discuss any questions you have with your health care provider. °  °Document Released: 03/10/2005 Document Revised: 07/25/2013 Document Reviewed: 03/01/2012 °Elsevier Interactive Patient Education ©2016 [employer] ° °

## 2015-04-07 NOTE — ED Notes (Signed)
Patient brought in by [facility] from home, patient pulled his urinary catheter out. Per EMS, wife does not want catheter put back in until we have spoke with patients urologist.

## 2015-04-07 NOTE — ED Provider Notes (Signed)
[facility] Emergency Department Provider Note   L5 caveat: Review of systems and history is limited by dementia  Time seen: ----------------------------------------- 2:23 PM on 04/07/2015 -----------------------------------------    I have reviewed the triage vital signs and the nursing notes.   HISTORY  Chief Complaint Other    HPI Shane Mooney. is a 75 y.o. male who presents ER after he pulled his Foley catheter out. EMS stated that the patient's wife did not want to have the catheter put back in until he spoke to the patient's urologist. Patient denies any complaints currently,review of systems and history is limited by his dementia and Parkinson's disease   Past Medical History  Diagnosis Date  . Ulcer   . Hernia     LIH  . 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 Cancer ([place]) 2013    basal cell carcinoma back and face  . Personal history of colonic polyps   . Parkinson disease [facility]) April 2014  . Dementia     Patient Active Problem List   Diagnosis Date Noted  . Urinary retention   . Acute renal failure (ARF) ([place]) 03/10/2015  . Diastolic dysfunction 0000000  . Carotid atherosclerosis 03/07/2015  . TIA (transient ischemic attack) 03/06/2015  . 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 Hallucination 05/09/2014  . Bilateral hearing loss 01/10/2014  . Parkinson's disease ([place]) 01/10/2014  . 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 Personal history of colonic polyps 09/05/2011    Past Surgical History  Procedure Laterality Date  . Colonoscopy  2012    Dr Bary Castilla  . Hernia repair  2000    LIH  . Repair of perforated ulcer  1994  . Cholecystectomy  2004  . Hemicolectomy Right 08/2011  . Skin cancer excision Left 2013    neck  . Polypectomy  2006     benign    Allergies Review of patient's allergies indicates no known allergies.  Social History Social History  Substance Use Topics  . Smoking status: Former  Smoker -- 0.50 packs/day for 10 years    Types: Cigarettes    Quit date: 06/05/1974  . Smokeless tobacco: Never Used  . Alcohol Use: No    Review of Systems Unknown at this time, patient denies complaints ____________________________________________   PHYSICAL EXAM:  VITAL SIGNS: ED Triage Vitals  Enc Vitals Group     BP 04/07/15 1406 177/84 mmHg     Pulse Rate 04/07/15 1406 72     Resp 04/07/15 1406 14     Temp 04/07/15 1406 98 F (36.7 C)     Temp Source 04/07/15 1406 Oral     SpO2 04/07/15 1406 96 %     Weight 04/07/15 1406 162 lb 11.2 oz (73.8 kg)     Height 04/07/15 1406 5\' 7"  (1.702 m)     Head Cir --      Peak Flow --      Pain Score 04/07/15 1407 0     Pain Loc --      Pain Edu? --      Excl. in [place]? --     Constitutional: Alert but disoriented. Well appearing and in no distress. Eyes: Conjunctivae are normal. PERRL. Normal extraocular movements. Cardiovascular: Normal rate, regular rhythm. No murmurs, rubs, or gallops. Respiratory: Normal respiratory effort without tachypnea nor retractions. Breath sounds are clear and equal bilaterally.  Gastrointestinal: Soft and nontender. No distention. No abdominal bruits.  Genitourinary: Blood is at the urethral meatus  Musculoskeletal: Nontender with limited range of motion of his extremities Neurologic:  No gross focal neurologic deficits are appreciated Skin:  Skin is warm, dry and intact. No rash noted. ____________________________________________  ED COURSE:  Pertinent labs & imaging results that were available during my care of the patient were reviewed by me and considered in my medical decision making (see chart for details). Patient comes from home after removing his Foley catheter. There is not significant bleeding currently. We will attempt to replace Foley catheter.  ____________________________________________  FINAL ASSESSMENT AND PLAN  Foley catheter replacement  Plan: Patient with labs and imaging  as dictated above. Nurses were able to replace a Foley catheter. We have tried to secure the catheter better to his leg. Otherwise he denies complaints, I will also increase his trazodone at night to help him sleep.   Earleen Newport, MD   Earleen Newport, MD 04/07/15 [phone]

## 2015-04-09 ENCOUNTER — Telehealth: Payer: Self-pay

## 2015-04-09 ENCOUNTER — Encounter: Payer: Self-pay | Admitting: Urology

## 2015-04-09 NOTE — Telephone Encounter (Signed)
Spoke with pt wife in reference to foley cath. Read wife Dr. Cherrie Gauze suggestions. Wife voiced understanding. Reinforced with wife to give [place] a call back if she has any more problems.

## 2015-04-09 NOTE — Telephone Encounter (Signed)
This is a really tough situation because he needs the catheter (given his history of profound retention) because he failed his voiding trial.  Does the family feel up for learning CIC and catheterizing him 4x daily?  I just dont think he will be compliant with this and also he's now had fairly significant catheter trauma.  Bleeding around the catheter is from catheter trauma.    For now, this is our best option although less than ideal.  He has a repeat voiding trial scheduled for in 2 weeks with me and we can discuss SPT and other options moving forward but will have similar problems with the SPT.    Best case scenario, he passes his voiding trial at the next visit.  Keep taking Flomax.  Will discuss at length next visit.    Hollice Espy, MD

## 2015-04-09 NOTE — Progress Notes (Signed)
04/06/2015 9:08 AM   Shane Mooney. 30-Sep-1940 [id number]  Referring provider: Steele Sizer, MD [address] [place], [place] [address]  Chief Complaint  Patient presents with  . Urinary Retention    New Patient- voiding trial    HPI: 75 year old man initially seen as a consult while inpatient on 03/11/2015 when he was admitted with acute renal failure, massive urinary retention with greater than 2 L in the bladder, bilateral hydronephrosis with extravasation of perinephric fluid consistent with forniceal rupture, and a questionable extraperitoneal bladder injury with a wisp of contrast in the space of [place]. A Foley catheter was placed and his creatinine began to improve significantly prior to discharge.  Cystogram this morning shows no evidence of residual bladder injury, no extravasation of contrast both with bladder filling and in the post image films.  He returns today to the office with his wife and daughter to discuss further management of his bladder. He does have significant dementia secondary to low a body dementia and Parkinson's disease. He is unable to provide any additional history but he was previously followed at [facility] urology by Dr. cope in the remote past. He has been taking Flomax since his admission.  He is not able to tolerate the catheter well. He became agitated upon the hospital and removed his catheter on his own during the hospitalization.  He was initially discharged to a nursing home but his sense then discharged back to home where his wife is his primary caretaker.  He was treated for urinary tract infection while in the nursing home.  Voiding trial performed today in clinic at which time patient did have sensation in his bladder around 500 cc but was unable to void after greater than 1 hour. The catheter was replaced.   PMH: Past Medical History  Diagnosis Date  . Ulcer   . Hernia     LIH  . Alcoholic gastritis without mention of  hemorrhage   . Cancer ([place]) 2013    basal cell carcinoma back and face  . Personal history of colonic polyps   . Parkinson disease [facility]) April 2014  . Dementia     Surgical History: Past Surgical History  Procedure Laterality Date  . Colonoscopy  2012    Dr Bary Castilla  . Hernia repair  2000    LIH  . Repair of perforated ulcer  1994  . Cholecystectomy  2004  . Hemicolectomy Right 08/2011  . Skin cancer excision Left 2013    neck  . Polypectomy  2006     benign    Home Medications:    Medication List       This list is accurate as of: 04/06/15 11:59 PM.  Always use your most recent med list.               carbidopa-levodopa 25-100 MG tablet  Commonly known as:  SINEMET IR  Take 0.5 tablets by mouth 3 (three) times daily.     QUEtiapine 25 MG tablet  Commonly known as:  SEROQUEL  1 tablet in the morning, 1/2 tablet in the afternoon, 1 at night. Patient can take an extra 1/2 tablet daily as needed     rivastigmine 9.5 mg/24hr  Commonly known as:  EXELON  Place 1 patch (9.5 mg total) onto the skin daily.     tamsulosin 0.4 MG Caps capsule  Commonly known as:  FLOMAX  Take 1 capsule (0.4 mg total) by mouth at bedtime.  traZODone 50 MG tablet  Commonly known as:  DESYREL  Take 50 mg by mouth at bedtime.     VITAMIN B-12 PO  Take 1,000 mcg by mouth daily.     VITAMIN D PO  Take 2,000 Units by mouth daily.        Allergies: No Known Allergies  Family History: Family History  Problem Relation Age of Onset  . Diabetes Neg Hx   . Bladder Cancer Neg Hx   . Prostate cancer Neg Hx   . Kidney cancer Neg Hx     Social History:  reports that he quit smoking about 40 years ago. His smoking use included Cigarettes. He has a 5 pack-year smoking history. He has never used smokeless tobacco. He reports that he does not drink alcohol or use illicit drugs.  ROS (per his family): UROLOGY Frequent Urination?: No Hard to postpone urination?: No Burning/pain with  urination?: No Get up at night to urinate?: No Leakage of urine?: No Urine stream starts and stops?: No Trouble starting stream?: No Do you have to strain to urinate?: No Blood in urine?: No Urinary tract infection?: No Sexually transmitted disease?: No Injury to kidneys or bladder?: No Painful intercourse?: No Weak stream?: No Erection problems?: No Penile pain?: No  Gastrointestinal Nausea?: No Vomiting?: No Indigestion/heartburn?: No Diarrhea?: Yes Constipation?: No  Constitutional Fever: No Night sweats?: No Weight loss?: No Fatigue?: No  Skin Skin rash/lesions?: Yes Itching?: No  Eyes Blurred vision?: No Double vision?: No  Ears/Nose/Throat Sore throat?: No Sinus problems?: No  Hematologic/Lymphatic Swollen glands?: No Easy bruising?: Yes  Cardiovascular Leg swelling?: No Chest pain?: No  Respiratory Cough?: No Shortness of breath?: No  Endocrine Excessive thirst?: No  Musculoskeletal Back pain?: Yes Joint pain?: No  Neurological Headaches?: No Dizziness?: No  Psychologic Depression?: No Anxiety?: Yes  Physical Exam: BP 89/56 mmHg  Pulse 70  Ht 5\' 10"  (1.778 m)  Wt 170 lb (77.111 kg)  BMI 24.39 kg/m2  Constitutional: No acute distress.  Demented, unable to provide history. HEENT: [place] AT, moist mucus membranes.  Trachea midline, no masses. Cardiovascular: No clubbing, cyanosis, or edema. Respiratory: Normal respiratory effort, no increased work of breathing. GI: Abdomen is soft, nontender, nondistended, no abdominal masses GU: No CVA tenderness. Foley catheter in place draining clear yellow urine. Skin: No rashes, bruises or suspicious lesions. Neurologic: Grossly intact, no focal deficits, moving all 4 extremities. Psychiatric: Agitated. Rectal exam deferred today as patient is agitated.  Laboratory Data: Lab Results  Component Value Date   WBC 8.2 03/11/2015   HGB 11.1* 03/11/2015   HCT 32.5* 03/11/2015   MCV 91.2  03/11/2015   PLT 181 03/11/2015    Lab Results  Component Value Date   CREATININE 1.44* 03/11/2015    Urinalysis    Component Value Date/Time   COLORURINE YELLOW* 03/10/2015 1818   APPEARANCEUR CLEAR* 03/10/2015 1818   LABSPEC 1.024 03/10/2015 1818   PHURINE 5.0 03/10/2015 1818   GLUCOSEU NEGATIVE 03/10/2015 1818   HGBUR 2+* 03/10/2015 1818   BILIRUBINUR NEGATIVE 03/10/2015 1818   KETONESUR NEGATIVE 03/10/2015 1818   PROTEINUR NEGATIVE 03/10/2015 1818   NITRITE NEGATIVE 03/10/2015 1818   LEUKOCYTESUR NEGATIVE 03/10/2015 1818    Pertinent Imaging: CLINICAL DATA: Renal failure. Evaluate for bladder injury.  EXAM: CYSTOGRAM  TECHNIQUE: After catheterization of the urinary bladder following sterile technique the bladder was filled with 375 mL Cysto-Hypaque 30% by drip infusion. Serial spot images were obtained during bladder filling and post draining.  FLUOROSCOPY TIME: Radiation dose 24.4 mGy. Nine images. Fluoroscopy time 1 minutes 6 seconds .  COMPARISON: CT 03/10/2015.  FINDINGS: Foley catheter is in the bladder. The bladder appears normal. No evidence of contrast extravasation. Postvoid residual normal.  IMPRESSION: Normal exam.   Electronically Signed  By: Marcello Moores Register  On: 04/06/2015 10:52  Assessment & Plan:    1. Urinary retention Failed voiding trial today. Catheter replaced. Recommend continuation of Flomax. Discussed bladder management options moving forward if unable to pass next voiding trial including chronic indwelling Foley catheter, placement of a suprapubic tube, or clean intermittent catheterization. Rectal exam deferred today due to patient agitation. Discussed that this will need to be performed at the next visit to which his family was agreeable. - tamsulosin (FLOMAX) 0.4 MG CAPS capsule; Take 1 capsule (0.4 mg total) by mouth at bedtime.  Dispense: 30 capsule; Refill: 11  2. Bilateral hydronephrosis Plan follow-up  next visit with a renal ultrasound prior to assess for resolution of hydronephrosis and perinephric fluid We will recheck BMP at next visit - US Renal  3. Unspecified injury of bladder, subsequent encounter Cystogram negative, appropriate for voiding trial but failed today   Return in about 2 weeks (around 04/20/2015) for f/u RUS, repeat voiding trial.  Hollice Espy, MD  [facility] [place] [address] [place], [place] [address] [phone]

## 2015-04-09 NOTE — Telephone Encounter (Signed)
Pt wife called after hours triage line. Attempted to contact pt wife this morning to see how things are going. LMOM.

## 2015-04-09 NOTE — Telephone Encounter (Signed)
Spoke with pt wife, Shane Mooney, who was very upset and was stating she cant handle this and does not know what else to do. Wife stated pt pulled catheter out  Saturday, they went to the ER, foley was replaced and pt put on abx. Wife stated since they have been home from ER pt continues to pull at catheter and play with the tubing-to the point he has wrapped it around the leg of dinning room table, gotten on the floor and stuffed it under the couch, and blood was all over his bed this morning. Wife wants to know is there anything else that can be done. Please advise.

## 2015-04-11 ENCOUNTER — Telehealth: Payer: Self-pay | Admitting: Neurology

## 2015-04-11 NOTE — Telephone Encounter (Signed)
Esther, OT with [facility], called to get an order for an aid to come out and help patient 3 times a week. He is currently being taken care of by his wife and occasionally his daughter. He currently needs 24 hour care due to condition and recent UTI. She states family is having a hard time bathing him and being able to take care of him. Okay to give order for home health aid? Sherlynn Stalls can be reached at [phone].

## 2015-04-12 ENCOUNTER — Telehealth: Payer: Self-pay

## 2015-04-12 ENCOUNTER — Ambulatory Visit (INDEPENDENT_AMBULATORY_CARE_PROVIDER_SITE_OTHER): Payer: Medicare Other

## 2015-04-12 DIAGNOSIS — R339 Retention of urine, unspecified: Secondary | ICD-10-CM

## 2015-04-12 LAB — BLADDER SCAN AMB NON-IMAGING: Scan Result: 350

## 2015-04-12 NOTE — Telephone Encounter (Signed)
Pt wife called this morning stating pt has pulled the foley out again. Wife states this is the 4th time. Wife described situation to be she heard pt moving around about 5:30 this morning she went into his room and found blood everywhere with the catheter laying on the floor. Per wife pt is not currently bleeding. Pt has had breakfast and a normal amount of fluid intake. Wife denied pt to be  In pain at this time. Please advise.

## 2015-04-12 NOTE — Telephone Encounter (Signed)
LMOM making Sherlynn Stalls aware that home health aid was approved by Dr Tat.

## 2015-04-12 NOTE — Telephone Encounter (Signed)
Please have them come in around 11 today and bladder scan him.  If he has an elevated PVR 200+, please replace Foley.   I am really sorry about this situation.  Options are limited unless they can learn and do CIC.  Hollice Espy, MD

## 2015-04-12 NOTE — Telephone Encounter (Signed)
Start with 4x and keep a log of how much comes out and if he voids independently.   Come back as scheduled with log/ f/u renal ultrasound.    Hollice Espy, MD

## 2015-04-12 NOTE — Telephone Encounter (Signed)
Okay, but I will not be responsible for the UTI treatment, etc.  Agree that he needs 24 hour per day care and has for a long time.

## 2015-04-12 NOTE — Telephone Encounter (Signed)
Spoke with wife who stated she would prefer to learn CIC. How many times a day should she do this?

## 2015-04-12 NOTE — Progress Notes (Signed)
Continuous Intermittent Catheterization  Due to pt continuing to pull foley out patient is present today for a teaching of self I & O Catheterization. Patient was given detailed verbal and printed instructions of self catheterization. Patient was cleaned and prepped in a sterile fashion.  With instruction and assistance patient inserted a 14FR and urine return was noted 250 ml, urine was dark brown in color. Patient tolerated well, no complications were noted Patient was given a sample bag with supplies to take home.  Instructions were given per Dr. Erlene Quan for patient to cath 4 times daily.  An order was placed with [facility] for catheters to be sent to the patient's home. Patient is to follow up next week with Dr. Erlene Quan.  Preformed by: Toniann Fail, LPN   Additional Notes: pt kept pulling foley out. Wife was instructed to cath pt. Wife voiced understanding. Wife will keep a log to bring back to f/u appt.

## 2015-04-13 ENCOUNTER — Telehealth: Payer: Self-pay

## 2015-04-13 NOTE — Telephone Encounter (Signed)
Patient's physical therapist called stating that he has been having diarrhea for the past few days they believe due to the Bactrim that he is taking . They would like a doctors ok to start Imodium. She also mentioned that patient's BP was 70/40 today and he is very weak. It was asked if he could be dehydrated due to the diarrhea and to contact patient's PCP to get advice on labs or possible fluids. Per Dr. Erlene Quan would not recommend Imodium in case of the possiblity of infectious like C Diff, she recommends to go to the ER for fluids and labs. Called and spoke with patient's wife and notified her of Dr. Cherrie Gauze recommendation, she states that patient's BP has gone back up now and they will contact PCP for follow up advice.

## 2015-04-14 ENCOUNTER — Observation Stay
Admission: EM | Admit: 2015-04-14 | Discharge: 2015-04-16 | Disposition: A | Discharge status: Home / Self Care | Payer: Medicare Other | Attending: Internal Medicine | Admitting: Internal Medicine

## 2015-04-14 ENCOUNTER — Emergency Department: Payer: Medicare Other

## 2015-04-14 ENCOUNTER — Observation Stay: Payer: Medicare Other

## 2015-04-14 ENCOUNTER — Encounter: Payer: Self-pay | Admitting: Emergency Medicine

## 2015-04-14 DIAGNOSIS — Z79899 Other long term (current) drug therapy: Secondary | ICD-10-CM | POA: Diagnosis not present

## 2015-04-14 DIAGNOSIS — Z86010 Personal history of colonic polyps: Secondary | ICD-10-CM | POA: Insufficient documentation

## 2015-04-14 DIAGNOSIS — I739 Peripheral vascular disease, unspecified: Secondary | ICD-10-CM | POA: Insufficient documentation

## 2015-04-14 DIAGNOSIS — G3183 Dementia with Lewy bodies: Secondary | ICD-10-CM | POA: Insufficient documentation

## 2015-04-14 DIAGNOSIS — R339 Retention of urine, unspecified: Secondary | ICD-10-CM | POA: Diagnosis not present

## 2015-04-14 DIAGNOSIS — I6529 Occlusion and stenosis of unspecified carotid artery: Secondary | ICD-10-CM | POA: Diagnosis not present

## 2015-04-14 DIAGNOSIS — M4802 Spinal stenosis, cervical region: Secondary | ICD-10-CM | POA: Diagnosis not present

## 2015-04-14 DIAGNOSIS — Z87891 Personal history of nicotine dependence: Secondary | ICD-10-CM | POA: Insufficient documentation

## 2015-04-14 DIAGNOSIS — Z9049 Acquired absence of other specified parts of digestive tract: Secondary | ICD-10-CM | POA: Insufficient documentation

## 2015-04-14 DIAGNOSIS — N390 Urinary tract infection, site not specified: Secondary | ICD-10-CM | POA: Diagnosis not present

## 2015-04-14 DIAGNOSIS — G20 Parkinson's disease: Secondary | ICD-10-CM | POA: Insufficient documentation

## 2015-04-14 DIAGNOSIS — N179 Acute kidney failure, unspecified: Secondary | ICD-10-CM | POA: Diagnosis not present

## 2015-04-14 DIAGNOSIS — N411 Chronic prostatitis: Secondary | ICD-10-CM | POA: Insufficient documentation

## 2015-04-14 DIAGNOSIS — Z85828 Personal history of other malignant neoplasm of skin: Secondary | ICD-10-CM | POA: Diagnosis not present

## 2015-04-14 DIAGNOSIS — F0280 Dementia in other diseases classified elsewhere without behavioral disturbance: Secondary | ICD-10-CM | POA: Diagnosis not present

## 2015-04-14 DIAGNOSIS — H0520 Unspecified exophthalmos: Secondary | ICD-10-CM | POA: Insufficient documentation

## 2015-04-14 DIAGNOSIS — R4182 Altered mental status, unspecified: Secondary | ICD-10-CM | POA: Diagnosis not present

## 2015-04-14 DIAGNOSIS — K2920 Alcoholic gastritis without bleeding: Secondary | ICD-10-CM | POA: Insufficient documentation

## 2015-04-14 DIAGNOSIS — R410 Disorientation, unspecified: Secondary | ICD-10-CM | POA: Insufficient documentation

## 2015-04-14 DIAGNOSIS — G459 Transient cerebral ischemic attack, unspecified: Secondary | ICD-10-CM | POA: Diagnosis not present

## 2015-04-14 DIAGNOSIS — H9193 Unspecified hearing loss, bilateral: Secondary | ICD-10-CM | POA: Diagnosis not present

## 2015-04-14 DIAGNOSIS — F4489 Other dissociative and conversion disorders: Secondary | ICD-10-CM

## 2015-04-14 DIAGNOSIS — R400 Somnolence: Secondary | ICD-10-CM | POA: Insufficient documentation

## 2015-04-14 LAB — CBC WITH DIFFERENTIAL/PLATELET
Basophils Absolute: 0.1 10*3/uL (ref 0–0.1)
Basophils Relative: 1 %
EOS ABS: 0.1 10*3/uL (ref 0–0.7)
EOS PCT: 1 %
HCT: 37.7 % — ABNORMAL LOW (ref 40.0–52.0)
Hemoglobin: 12.9 g/dL — ABNORMAL LOW (ref 13.0–18.0)
LYMPHS ABS: 0.7 10*3/uL — AB (ref 1.0–3.6)
LYMPHS PCT: 8 %
MCH: 30.7 pg (ref 26.0–34.0)
MCHC: 34.2 g/dL (ref 32.0–36.0)
MCV: 89.8 fL (ref 80.0–100.0)
MONOS PCT: 6 %
Monocytes Absolute: 0.6 10*3/uL (ref 0.2–1.0)
Neutro Abs: 8.2 10*3/uL — ABNORMAL HIGH (ref 1.4–6.5)
Neutrophils Relative %: 84 %
PLATELETS: 203 10*3/uL (ref 150–440)
RBC: 4.2 MIL/uL — AB (ref 4.40–5.90)
RDW: 14.5 % (ref 11.5–14.5)
WBC: 9.7 10*3/uL (ref 3.8–10.6)

## 2015-04-14 LAB — COMPREHENSIVE METABOLIC PANEL
ALK PHOS: 65 U/L (ref 38–126)
ALT: 20 U/L (ref 17–63)
ANION GAP: 8 (ref 5–15)
AST: 18 U/L (ref 15–41)
Albumin: 4 g/dL (ref 3.5–5.0)
BUN: 19 mg/dL (ref 6–20)
CALCIUM: 9.5 mg/dL (ref 8.9–10.3)
CHLORIDE: 102 mmol/L (ref 101–111)
CO2: 27 mmol/L (ref 22–32)
CREATININE: 0.98 mg/dL (ref 0.61–1.24)
Glucose, Bld: 113 mg/dL — ABNORMAL HIGH (ref 65–99)
Potassium: 4.4 mmol/L (ref 3.5–5.1)
SODIUM: 137 mmol/L (ref 135–145)
Total Bilirubin: 2 mg/dL — ABNORMAL HIGH (ref 0.3–1.2)
Total Protein: 6.7 g/dL (ref 6.5–8.1)

## 2015-04-14 LAB — URINALYSIS COMPLETE WITH MICROSCOPIC (ARMC ONLY)
BILIRUBIN URINE: NEGATIVE
Glucose, UA: NEGATIVE mg/dL
Hgb urine dipstick: NEGATIVE
KETONES UR: NEGATIVE mg/dL
Leukocytes, UA: NEGATIVE
NITRITE: NEGATIVE
Protein, ur: NEGATIVE mg/dL
SPECIFIC GRAVITY, URINE: 1.017 (ref 1.005–1.030)
Squamous Epithelial / LPF: NONE SEEN
pH: 5 (ref 5.0–8.0)

## 2015-04-14 LAB — TSH: TSH: 1.028 u[IU]/mL (ref 0.350–4.500)

## 2015-04-14 LAB — TROPONIN I: Troponin I: <0.03 ng/mL (ref <0.031)

## 2015-04-14 LAB — LIPASE, BLOOD: LIPASE: 23 U/L (ref 11–51)

## 2015-04-14 MED ORDER — HEPARIN SODIUM (PORCINE) 5000 UNIT/ML IJ SOLN
5000.0000 [IU] | Freq: Three times a day (TID) | INTRAMUSCULAR | Status: DC
  Administered 2015-04-14 – 2015-04-16 (×5): 5000 [IU] via SUBCUTANEOUS
  Filled 2015-04-14 (×5): qty 1

## 2015-04-14 MED ORDER — MORPHINE SULFATE (PF) 2 MG/ML IV SOLN: 2.0000 mg | INTRAVENOUS | Status: DC | PRN

## 2015-04-14 MED ORDER — BISACODYL 10 MG RE SUPP: 10.0000 mg | Freq: Every day | RECTAL | Status: DC | PRN

## 2015-04-14 MED ORDER — QUETIAPINE FUMARATE 25 MG PO TABS
25.0000 mg | ORAL_TABLET | Freq: Two times a day (BID) | ORAL | Status: DC
  Administered 2015-04-14 – 2015-04-16 (×3): 25 mg via ORAL
  Filled 2015-04-14 (×3): qty 1

## 2015-04-14 MED ORDER — ACETAMINOPHEN 325 MG PO TABS
650.0000 mg | ORAL_TABLET | Freq: Four times a day (QID) | ORAL | Status: DC | PRN
Start: 2015-04-14 — End: 2015-04-16

## 2015-04-14 MED ORDER — DOCUSATE SODIUM 100 MG PO CAPS
100.0000 mg | ORAL_CAPSULE | Freq: Two times a day (BID) | ORAL | Status: DC
  Administered 2015-04-14 – 2015-04-16 (×3): 100 mg via ORAL
  Filled 2015-04-14 (×3): qty 1

## 2015-04-14 MED ORDER — DEXTROSE 5 % IV SOLN
1.0000 g | INTRAVENOUS | Status: DC
  Administered 2015-04-14 – 2015-04-15 (×2): 1 g via INTRAVENOUS
  Filled 2015-04-14 (×3): qty 10

## 2015-04-14 MED ORDER — VITAMIN D 1000 UNITS PO TABS
2000.0000 [IU] | ORAL_TABLET | Freq: Every day | ORAL | Status: DC
Start: 2015-04-14 — End: 2015-04-16
  Administered 2015-04-15 – 2015-04-16 (×2): 2000 [IU] via ORAL
  Filled 2015-04-14 (×3): qty 2

## 2015-04-14 MED ORDER — SODIUM CHLORIDE 0.9 % IV SOLN
INTRAVENOUS | Status: DC
  Administered 2015-04-14 – 2015-04-15 (×3): via INTRAVENOUS

## 2015-04-14 MED ORDER — CARBIDOPA-LEVODOPA 25-100 MG PO TABS
1.0000 | ORAL_TABLET | Freq: Two times a day (BID) | ORAL | Status: DC
  Administered 2015-04-15 (×2): 1 via ORAL
  Filled 2015-04-14 (×3): qty 1

## 2015-04-14 MED ORDER — VITAMIN B-12 1000 MCG PO TABS
1000.0000 ug | ORAL_TABLET | Freq: Every day | ORAL | Status: DC
  Administered 2015-04-15: 10:00:00 1000 ug via ORAL
  Filled 2015-04-14 (×2): qty 1

## 2015-04-14 MED ORDER — TRAZODONE HCL 100 MG PO TABS
100.0000 mg | ORAL_TABLET | Freq: Every day | ORAL | Status: DC
  Administered 2015-04-14: 100 mg via ORAL
  Filled 2015-04-14: qty 1

## 2015-04-14 MED ORDER — ACETAMINOPHEN 650 MG RE SUPP
650.0000 mg | Freq: Four times a day (QID) | RECTAL | Status: DC | PRN
Start: 2015-04-14 — End: 2015-04-16

## 2015-04-14 MED ORDER — CARBIDOPA-LEVODOPA 25-100 MG PO TABS
0.5000 | ORAL_TABLET | Freq: Every morning | ORAL | Status: DC
  Administered 2015-04-15 – 2015-04-16 (×2): 0.5 via ORAL
  Filled 2015-04-14 (×2): qty 1

## 2015-04-14 MED ORDER — CARBIDOPA-LEVODOPA 25-100 MG PO TABS: 0.5000 | ORAL_TABLET | ORAL | Status: DC

## 2015-04-14 MED ORDER — TAMSULOSIN HCL 0.4 MG PO CAPS
0.4000 mg | ORAL_CAPSULE | Freq: Every day | ORAL | Status: DC
  Administered 2015-04-14: 0.4 mg via ORAL
  Filled 2015-04-14: qty 1

## 2015-04-14 MED ORDER — RIVASTIGMINE 9.5 MG/24HR TD PT24
9.5000 mg | MEDICATED_PATCH | Freq: Every day | TRANSDERMAL | Status: DC
  Administered 2015-04-14 – 2015-04-16 (×3): 9.5 mg via TRANSDERMAL
  Filled 2015-04-14 (×4): qty 1

## 2015-04-14 MED ORDER — ONDANSETRON HCL 4 MG/2ML IJ SOLN: 4.0000 mg | Freq: Four times a day (QID) | INTRAMUSCULAR | Status: DC | PRN

## 2015-04-14 MED ORDER — SODIUM CHLORIDE 0.9 % IV BOLUS (SEPSIS)
1000.0000 mL | Freq: Once | INTRAVENOUS | Status: AC
  Administered 2015-04-14: 1000 mL via INTRAVENOUS

## 2015-04-14 MED ORDER — ONDANSETRON HCL 4 MG PO TABS: 4.0000 mg | ORAL_TABLET | Freq: Four times a day (QID) | ORAL | Status: DC | PRN

## 2015-04-14 NOTE — ED Notes (Signed)
Slept longer then usual, and was mumbling and uncooperative.  Hx of uti, parkinsons, dementia.

## 2015-04-14 NOTE — ED Notes (Signed)
Patient left for imaging. 

## 2015-04-14 NOTE — H&P (Signed)
History and Physical    Shane Mooney. [id number] DOB: 07-22-1940 DOA: 04/14/2015  Referring physician: Dr. Clearnce Hasten PCP: Loistine Chance, MD  Specialists: Dr. Carles Collet  Chief Complaint: confusion  HPI: Shane Mooney. is a 75 y.o. male has a past medical history significant for Parkinson's with lewey body dementia as well as urinary retention with recurrent UTI's here with worsening confusion. Family unable to care for patient at home. In ER, work up unremarkable but patient remains severely confused and incapacitated. He is now admitted.  Review of Systems: The family denies anorexia, fever, weight loss,, vision loss,  hoarseness, chest pain, syncope, dyspnea on exertion, peripheral edema, balance deficits, hemoptysis, abdominal pain, melena, hematochezia, severe indigestion/heartburn, hematuria, incontinence, genital sores, muscle weakness, suspicious skin lesions, transient blindness,  depression, unusual weight change, abnormal bleeding, enlarged lymph nodes, angioedema, and breast masses.   Past Medical History  Diagnosis Date  . Ulcer   . Hernia     LIH  . 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 Cancer ([place]) 2013    basal cell carcinoma back and face  . Personal history of colonic polyps   . Parkinson disease [facility]) April 2014  . Dementia    Past Surgical History  Procedure Laterality Date  . Colonoscopy  2012    Dr Bary Castilla  . Hernia repair  2000    LIH  . Repair of perforated ulcer  1994  . Cholecystectomy  2004  . Hemicolectomy Right 08/2011  . Skin cancer excision Left 2013    neck  . Polypectomy  2006     benign   Social History:  reports that he quit smoking about 40 years ago. His smoking use included Cigarettes. He has a 5 pack-year smoking history. He has never used smokeless tobacco. He reports that he does not drink alcohol or use illicit drugs.  No Known Allergies  Family History  Problem Relation Age of Onset  . Diabetes Neg Hx    . Bladder Cancer Neg Hx   . Prostate cancer Neg Hx   . Kidney cancer Neg Hx     Prior to Admission medications   Medication Sig Start Date End Date Taking? Authorizing Provider  carbidopa-levodopa (SINEMET IR) 25-100 MG tablet Take 0.5 tablets by mouth 3 (three) times daily. Patient taking differently: Take 0.5-1 tablets by mouth See admin instructions. Take 1/2 tablet by mouth every morning. Take 1 tablet by mouth at noon and evening. 01/25/15  Yes Rebecca S Tat, DO  Cholecalciferol 2000 units TABS Take 2,000 Units by mouth daily.   Yes Historical Provider, MD  Cyanocobalamin (VITAMIN B-12 PO) Take 1,000 mcg by mouth daily.   Yes Historical Provider, MD  QUEtiapine (SEROQUEL) 25 MG tablet 1 tablet in the morning, 1/2 tablet in the afternoon, 1 at night. Patient can take an extra 1/2 tablet daily as needed 01/25/15  Yes Rebecca S Tat, DO  rivastigmine (EXELON) 9.5 mg/24hr Place 1 patch (9.5 mg total) onto the skin daily. 01/25/15  Yes Rebecca S Tat, DO  sulfamethoxazole-trimethoprim (BACTRIM DS) 800-160 MG tablet Take 1 tablet by mouth 2 (two) times daily. 04/07/15  Yes Earleen Newport, MD  tamsulosin (FLOMAX) 0.4 MG CAPS capsule Take 1 capsule (0.4 mg total) by mouth at bedtime. 04/06/15  Yes Hollice Espy, MD  traZODone (DESYREL) 100 MG tablet Take 1 tablet (100 mg total) by mouth at bedtime. 04/07/15  Yes Earleen Newport, MD  traZODone (DESYREL) 50 MG tablet  Take 100 mg by mouth at bedtime.  04/03/15  Yes Historical Provider, MD   Physical Exam: Filed Vitals:   04/14/15 1300 04/14/15 1415 04/14/15 1430 04/14/15 1500  BP: 130/75 159/83 152/83 158/78  Pulse: 66 68 64 62  Temp: 99.6 F (37.6 C)     TempSrc: Rectal     Resp: 13 16 24 14   Height:      Weight:      SpO2: 99% 100% 99% 99%     General:  No apparent distress, confused but verbal  Eyes: PERRL, EOMI, no scleral icterus, conjunctiva clear  ENT: dry oropharynx without exudate or erythema  Neck: supple, no  lymphadenopathy, no JVD or thyromegaly  Cardiovascular: regular rate without MRG; 2+ peripheral pulses, no JVD, no peripheral edema  Respiratory: CTA biL, good air movement without wheezing, rhonchi or crackled  Abdomen: soft, non tender to palpation, positive bowel sounds, no guarding, no rebound  Skin: no rashes  Musculoskeletal: normal bulk and tone, no joint swelling  Psychiatric: normal mood and affect but confused  Neurologic: CN 2-12 grossly intact, motor strength 5/5 in all 4 muscle groups. Baseline tremor noted.  Labs on Admission:  Basic Metabolic Panel:  Recent Labs Lab 04/14/15 1207  NA 137  K 4.4  CL 102  CO2 27  GLUCOSE 113*  BUN 19  CREATININE 0.98  CALCIUM 9.5   Liver Function Tests:  Recent Labs Lab 04/14/15 1207  AST 18  ALT 20  ALKPHOS 65  BILITOT 2.0*  PROT 6.7  ALBUMIN 4.0    Recent Labs Lab 04/14/15 1207  LIPASE 23   No results for input(s): AMMONIA in the last 168 hours. CBC:  Recent Labs Lab 04/14/15 1207  WBC 9.7  NEUTROABS 8.2*  HGB 12.9*  HCT 37.7*  MCV 89.8  PLT 203   Cardiac Enzymes:  Recent Labs Lab 04/14/15 1207  TROPONINI <0.03    BNP (last 3 results) No results for input(s): BNP in the last 8760 hours.  ProBNP (last 3 results) No results for input(s): PROBNP in the last 8760 hours.  CBG: No results for input(s): GLUCAP in the last 168 hours.  Radiological Exams on Admission: Dg Chest 1 View  04/14/2015  CLINICAL DATA:  Altered mental status EXAM: CHEST 1 VIEW COMPARISON:  03/10/2015 FINDINGS: Cardiomediastinal silhouette is stable. No acute infiltrate or pleural effusion. No pulmonary edema. Mild degenerative changes bilateral shoulders. IMPRESSION: No active disease. Electronically Signed   By: Lahoma Crocker M.D.   On: 04/14/2015 13:11   Ct Head Wo Contrast  04/14/2015  CLINICAL DATA:  Increasing somnolence. Patient is being treated for UTI. EXAM: CT HEAD WITHOUT CONTRAST TECHNIQUE: Contiguous axial  images were obtained from the base of the skull through the vertex without intravenous contrast. COMPARISON:  03/10/2015, head CT. FINDINGS: Brain: No evidence of acute infarction, hemorrhage, extra-axial collection, ventriculomegaly, or mass effect. There is moderate brain parenchymal atrophy and mild periventricular chronic small vessel disease changes. Remote Vascular: No hyperdense vessel or unexpected calcification. Skull: Negative for fracture or focal lesion. Sinuses/Orbits: No acute findings. Other: None. IMPRESSION: No acute intracranial abnormality. Atrophy, chronic microvascular disease. Electronically Signed   By: Fidela Salisbury M.D.   On: 04/14/2015 14:18    EKG: Independently reviewed.  Assessment/Plan Principal Problem:   Altered mental status Active Problems:   Chronic prostatitis   Dementia with Lewy bodies   Parkinson's disease ([place])   Urinary retention   Will observe on floor with IV fluids and empiric  IV ABX. Cultures sent. Will order MRI of brain and consult Neurology. CM consult for D/C planning.  Diet: soft Fluids: NS@100  DVT Prophylaxis: SQ Heparin  Code Status: DNR  Family Communication: yes Disposition Plan: home  Time spent: 50 min

## 2015-04-14 NOTE — Progress Notes (Signed)
Patient given activity blanket. Madlyn Frankel, RN

## 2015-04-14 NOTE — ED Notes (Signed)
Spoke with denise at poison control regarding the pt swallowing the mercury from glass thermometer - per poison control not absorbed through stomach and would not cause change in mental status

## 2015-04-14 NOTE — ED Provider Notes (Signed)
[facility] Emergency Department Provider Note  ____________________________________________  Time seen: Approximately 1230 PM  I have reviewed the triage vital signs and the nursing notes.   HISTORY  Chief Complaint Altered Mental Status    HPI Shane Mooney. is a 75 y.o. male history of Parkinson's disease and urinary tract infections secondary to urinary retention who is presenting today with altered mental status and diffuse weakness. Per the patient's wife was at the bedside the patient, starting in December, began having urinary retention. He was then renal failure which has since resolved. He was previously using a Foley catheter but has pulled out repeatedly and needed emergency department visits in order to replace it. Since this past Thursday he has been self cathetered by his wife. She says that the last time she will catheterize him was last night and returned 300 cc of urine. She says that when the patient awoke at 8 AM this morning he was altered and not following commands. He is usually able to roll onto his side when asked and is not able to do this. He is also been weak throughout his entire body. The wife says that she has been pushing fluids and food lately. However, she says that she thinks his urine output has been decreased when she catheterizes him. The patient denies any pain at this time.   Past Medical History  Diagnosis Date  . Ulcer   . Hernia     LIH  . 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 Cancer ([place]) 2013    basal cell carcinoma back and face  . Personal history of colonic polyps   . Parkinson disease [facility]) April 2014  . Dementia     Patient Active Problem List   Diagnosis Date Noted  . Urinary retention   . Acute renal failure (ARF) ([place]) 03/10/2015  . Diastolic dysfunction 0000000  . Carotid atherosclerosis 03/07/2015  . TIA (transient ischemic attack) 03/06/2015  . Dementia with Lewy bodies  05/10/2014  . Hallucination 05/09/2014  . Bilateral hearing loss 01/10/2014  . Parkinson's disease ([place]) 01/10/2014  . 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 Personal history of colonic polyps 09/05/2011    Past Surgical History  Procedure Laterality Date  . Colonoscopy  2012    Dr Bary Castilla  . Hernia repair  2000    LIH  . Repair of perforated ulcer  1994  . Cholecystectomy  2004  . Hemicolectomy Right 08/2011  . Skin cancer excision Left 2013    neck  . Polypectomy  2006     benign    Current Outpatient Rx  Name  Route  Sig  Dispense  Refill  . carbidopa-levodopa (SINEMET IR) 25-100 MG tablet   Oral   Take 0.5 tablets by mouth 3 (three) times daily.   45 tablet   5   . Cholecalciferol (VITAMIN D PO)   Oral   Take 2,000 Units by mouth daily.         . Cyanocobalamin (VITAMIN B-12 PO)   Oral   Take 1,000 mcg by mouth daily.         . QUEtiapine (SEROQUEL) 25 MG tablet      1 tablet in the morning, 1/2 tablet in the afternoon, 1 at night. Patient can take an extra 1/2 tablet daily as needed   90 tablet   5   . rivastigmine (EXELON) 9.5 mg/24hr   Transdermal   Place 1 patch (9.5 mg total) onto the  skin daily.   30 patch   5   . sulfamethoxazole-trimethoprim (BACTRIM DS) 800-160 MG tablet   Oral   Take 1 tablet by mouth 2 (two) times daily.   20 tablet   0   . tamsulosin (FLOMAX) 0.4 MG CAPS capsule   Oral   Take 1 capsule (0.4 mg total) by mouth at bedtime.   30 capsule   11   . traZODone (DESYREL) 100 MG tablet   Oral   Take 1 tablet (100 mg total) by mouth at bedtime.   30 tablet   2   . traZODone (DESYREL) 50 MG tablet   Oral   Take 50 mg by mouth at bedtime.      0     Allergies Review of patient's allergies indicates no known allergies.  Family History  Problem Relation Age of Onset  . Diabetes Neg Hx   . Bladder Cancer Neg Hx   . Prostate cancer Neg Hx   . Kidney cancer Neg Hx     Social History Social History  Substance Use  Topics  . Smoking status: Former Smoker -- 0.50 packs/day for 10 years    Types: Cigarettes    Quit date: 06/05/1974  . Smokeless tobacco: Never Used  . Alcohol Use: No    Review of Systems Constitutional: No fever/chills Eyes: No visual changes. ENT: No sore throat. Cardiovascular: Denies chest pain. Respiratory: Denies shortness of breath. Gastrointestinal: No abdominal pain.  No nausea, no vomiting.  No diarrhea.  No constipation. Genitourinary: Negative for dysuria. Musculoskeletal: Negative for back pain. Skin: Negative for rash. Neurological: Negative for headaches, focal weakness or numbness.  10-point ROS otherwise negative. However, review of systems is confounded by patient's decreased mental status at this time.  ____________________________________________   PHYSICAL EXAM:  VITAL SIGNS: ED Triage Vitals  Enc Vitals Group     BP 04/14/15 1139 144/75 mmHg     Pulse Rate 04/14/15 1139 72     Resp 04/14/15 1139 18     Temp --      Temp src --      SpO2 04/14/15 1139 100 %     Weight 04/14/15 1136 170 lb (77.111 kg)     Height 04/14/15 1136 5\' 10"  (1.778 m)     Head Cir --      Peak Flow --      Pain Score 04/14/15 1136 0     Pain Loc --      Pain Edu? --      Excl. in [place]? --     Constitutional: Alert. Ill-appearing and in no acute distress. Patient intermittently answering questions. I asked his wife how good his memory wasn't she says that sometimes he even forgets who she has. Eyes: Conjunctivae are normal. PERRL. EOMI. Head: Atraumatic. Nose: No congestion/rhinnorhea. Mouth/Throat: Mucous membranes are moist.  Oropharynx non-erythematous. Neck: No stridor.   Cardiovascular: Normal rate, regular rhythm. Grossly normal heart sounds.  Good peripheral circulation. Respiratory: Normal respiratory effort.  No retractions. Lungs CTAB. Gastrointestinal: Soft and nontender. No distention. No abdominal bruits. No CVA tenderness. Musculoskeletal: No lower  extremity tenderness nor edema.  No joint effusions. Neurologic:  Normal speech and language. No gross focal neurologic deficits are appreciated.  Skin:  Skin is warm, dry and intact. No rash noted. Psychiatric: Mood and affect are normal. Speech and behavior are normal.  ____________________________________________   LABS (all labs ordered are listed, but only abnormal results are displayed)  Labs Reviewed  CBC WITH DIFFERENTIAL/PLATELET - Abnormal; Notable for the following:    RBC 4.20 (*)    Hemoglobin 12.9 (*)    HCT 37.7 (*)    Neutro Abs 8.2 (*)    Lymphs Abs 0.7 (*)    All other components within normal limits  COMPREHENSIVE METABOLIC PANEL - Abnormal; Notable for the following:    Glucose, Bld 113 (*)    Total Bilirubin 2.0 (*)    All other components within normal limits  URINALYSIS COMPLETEWITH MICROSCOPIC ([facility] ONLY) - Abnormal; Notable for the following:    Color, Urine YELLOW (*)    APPearance CLEAR (*)    Bacteria, UA RARE (*)    All other components within normal limits  URINE CULTURE  LIPASE, BLOOD  TROPONIN I  TSH   ____________________________________________  EKG  ED ECG REPORT I, Doran Stabler, the attending physician, personally viewed and interpreted this ECG.   Date: 04/14/2015  EKG Time: 1325  Rate: 71  Rhythm: normal sinus rhythm  Axis: Normal axis  Intervals: Short PR interval  ST&T Change: No ST segment elevation or depression. No abnormal T-wave inversion.  ____________________________________________  RADIOLOGY  No active disease on chest x-ray. ____________________________________________   PROCEDURES   ____________________________________________   INITIAL IMPRESSION / ASSESSMENT AND PLAN / ED COURSE  Pertinent labs & imaging results that were available during my care of the patient were reviewed by me and considered in my medical decision making (see chart for  details).  ----------------------------------------- 3:40 PM on 04/14/2015 -----------------------------------------  Patient still with unchanged mental status. Very reassuring workup. Unclear cause of his altered mental status at this time. Possible CVA. Explained lab as well as imaging results to the patient as well as his wife was still the bedside. They agree with the plan for addition. Signed out to Dr. Doy Hutching. ____________________________________________   FINAL CLINICAL IMPRESSION(S) / ED DIAGNOSES  Altered mental status.    Orbie Pyo, MD 04/14/15 [phone]

## 2015-04-14 NOTE — ED Notes (Signed)
Also bit a glass thermometer and broke it last night and swallowed the mercury.

## 2015-04-15 ENCOUNTER — Observation Stay: Payer: Medicare Other

## 2015-04-15 DIAGNOSIS — R4182 Altered mental status, unspecified: Secondary | ICD-10-CM | POA: Diagnosis not present

## 2015-04-15 LAB — COMPREHENSIVE METABOLIC PANEL
ALBUMIN: 3.5 g/dL (ref 3.5–5.0)
ALT: 19 U/L (ref 17–63)
AST: 20 U/L (ref 15–41)
Alkaline Phosphatase: 62 U/L (ref 38–126)
Anion gap: 3 — ABNORMAL LOW (ref 5–15)
BUN: 14 mg/dL (ref 6–20)
CALCIUM: 8.9 mg/dL (ref 8.9–10.3)
CHLORIDE: 107 mmol/L (ref 101–111)
CO2: 29 mmol/L (ref 22–32)
Creatinine, Ser: 0.85 mg/dL (ref 0.61–1.24)
GFR calc Af Amer: >60 mL/min (ref >60)
GFR calc non Af Amer: >60 mL/min (ref >60)
Glucose, Bld: 94 mg/dL (ref 65–99)
POTASSIUM: 3.9 mmol/L (ref 3.5–5.1)
SODIUM: 139 mmol/L (ref 135–145)
TOTAL PROTEIN: 6.1 g/dL — AB (ref 6.5–8.1)
Total Bilirubin: 2.2 mg/dL — ABNORMAL HIGH (ref 0.3–1.2)

## 2015-04-15 LAB — CBC
HEMATOCRIT: 37.6 % — AB (ref 40.0–52.0)
Hemoglobin: 12.9 g/dL — ABNORMAL LOW (ref 13.0–18.0)
MCH: 31.3 pg (ref 26.0–34.0)
MCHC: 34.2 g/dL (ref 32.0–36.0)
MCV: 91.5 fL (ref 80.0–100.0)
PLATELETS: 184 10*3/uL (ref 150–440)
RBC: 4.11 MIL/uL — ABNORMAL LOW (ref 4.40–5.90)
RDW: 14.5 % (ref 11.5–14.5)
WBC: 6.6 10*3/uL (ref 3.8–10.6)

## 2015-04-15 LAB — GLUCOSE, CAPILLARY
GLUCOSE-CAPILLARY: 90 mg/dL (ref 65–99)
GLUCOSE-CAPILLARY: 105 mg/dL — AB (ref 65–99)

## 2015-04-15 LAB — URINE CULTURE

## 2015-04-15 MED ORDER — LORAZEPAM 2 MG/ML IJ SOLN
1.0000 mg | Freq: Once | INTRAMUSCULAR | Status: AC
  Administered 2015-04-15: 1 mg via INTRAVENOUS
  Filled 2015-04-15: qty 1

## 2015-04-15 NOTE — Progress Notes (Signed)
[place] [facility] [place] NAME: Shane Mooney    MR#:  [id number]  DATE OF BIRTH:  1940-07-28  SUBJECTIVE:  CHIEF COMPLAINT:   Chief Complaint  Patient presents with  . Altered Mental Status    per family pt is more altered today   Pt is drowsy, arosable to stimuli. Resting comfortably. I spoke to patient's wife later on to get more details, as per her at his baseline patient was alert but having dementia, and able to take care of himself with help of wife at home. But yesterday suddenly he was completely drowsy and more confused and not waking up.  REVIEW OF SYSTEMS:  Not able to get any details from patient. ROS  DRUG ALLERGIES:  No Known Allergies  VITALS:  Blood pressure 152/77, pulse 68, temperature 97.8 F (36.6 C), temperature source Oral, resp. rate 20, height 5\' 9"  (1.753 m), weight 69.128 kg (152 lb 6.4 oz), SpO2 94 %.  PHYSICAL EXAMINATION:  GENERAL:  75 y.o.-year-old patient lying in the bed with no acute distress.  EYES: Pupils equal, round, reactive to light . No scleral icterus. Extraocular muscles intact.  HEENT: Head atraumatic, normocephalic. Oropharynx and nasopharynx clear.  NECK:  Supple, no jugular venous distention. No thyroid enlargement, no tenderness.  LUNGS: Normal breath sounds bilaterally, no wheezing, rales,rhonchi or crepitation. No use of accessory muscles of respiration.  CARDIOVASCULAR: S1, S2 normal. No murmurs, rubs, or gallops.  ABDOMEN: Soft, nontender, nondistended. Bowel sounds present. No organomegaly or mass. Foley catheter in place. EXTREMITIES: No pedal edema, cyanosis, or clubbing.  NEUROLOGIC: Patient is drowsy, arousable to stimuli but does not carry out any communication. He moves his limbs to stimuli, but does not follow commands. PSYCHIATRIC: The patient is drowsy.  SKIN: No obvious rash, lesion, or ulcer.   Physical Exam LABORATORY PANEL:   CBC  Recent Labs Lab 04/15/15 0600   WBC 6.6  HGB 12.9*  HCT 37.6*  PLT 184   ------------------------------------------------------------------------------------------------------------------  Chemistries   Recent Labs Lab 04/15/15 0600  NA 139  K 3.9  CL 107  CO2 29  GLUCOSE 94  BUN 14  CREATININE 0.85  CALCIUM 8.9  AST 20  ALT 19  ALKPHOS 62  BILITOT 2.2*   ------------------------------------------------------------------------------------------------------------------  Cardiac Enzymes  Recent Labs Lab 04/14/15 1207  TROPONINI <0.03   ------------------------------------------------------------------------------------------------------------------  RADIOLOGY:  Dg Chest 1 View  04/14/2015  CLINICAL DATA:  Altered mental status EXAM: CHEST 1 VIEW COMPARISON:  03/10/2015 FINDINGS: Cardiomediastinal silhouette is stable. No acute infiltrate or pleural effusion. No pulmonary edema. Mild degenerative changes bilateral shoulders. IMPRESSION: No active disease. Electronically Signed   By: Lahoma Crocker M.D.   On: 04/14/2015 13:11   Ct Head Wo Contrast  04/14/2015  CLINICAL DATA:  Increasing somnolence. Patient is being treated for UTI. EXAM: CT HEAD WITHOUT CONTRAST TECHNIQUE: Contiguous axial images were obtained from the base of the skull through the vertex without intravenous contrast. COMPARISON:  03/10/2015, head CT. FINDINGS: Brain: No evidence of acute infarction, hemorrhage, extra-axial collection, ventriculomegaly, or mass effect. There is moderate brain parenchymal atrophy and mild periventricular chronic small vessel disease changes. Remote Vascular: No hyperdense vessel or unexpected calcification. Skull: Negative for fracture or focal lesion. Sinuses/Orbits: No acute findings. Other: None. IMPRESSION: No acute intracranial abnormality. Atrophy, chronic microvascular disease. Electronically Signed   By: Fidela Salisbury M.D.   On: 04/14/2015 14:18    ASSESSMENT AND PLAN:   Principal Problem:  Altered mental status Active Problems:   Chronic prostatitis   Dementia with Lewy bodies   Parkinson's disease ([place])   Urinary retention   Confusion state  * Confusion and altered mental status  As per wife this is sudden onset started yesterday.  There is no source of infection.  Patient was recently treated for UTI and was followed by urologist and was advised to have in and out catheter because he was trying to pull out his Foley catheter due to his dementia.  His urinalysis, chest x-ray, blood cultures are all negative.  We will do MRI to- check for any stroke.  We may need to call neurologic consult.  * Urinary retention  This is a chronic problem for patient now, continue Flomax. 2 frequent bladder scan and do in and out catheter as needed.  He is at high risk of pulling out the catheter so will DC Foley.  * Parkinson's disease  Continue medication.  * Dementia  Continue medication.  We'll get physical therapy evaluation, as he may need some placement.  All the records are reviewed and case discussed with Care Management/Social Workerr. Management plans discussed with the patient, family and they are in agreement.  CODE STATUS: DNR  TOTAL TIME TAKING CARE OF THIS PATIENT: 35 minutes.   POSSIBLE D/C IN 1-2 DAYS, DEPENDING ON CLINICAL CONDITION.   Vaughan Basta M.D on 04/15/2015   Between 7am to 6pm - Pager - [phone]  After 6pm go to [email or web address] - password [email or web address] [facility]  Office  [phone]  CC: Primary care physician; Loistine Chance, MD  Note: This dictation was prepared with Dragon dictation along with smaller phrase technology. Any transcriptional errors that result from this process are unintentional.

## 2015-04-15 NOTE — Evaluation (Signed)
Physical Therapy Evaluation Patient Details Name: Shane Mooney. MRN: [id number] DOB: [birth year] Today's Date: 04/15/2015   History of Present Illness  Pt is admitted for AMS, UTI, somnolence, coming from SNF recently.  Pt with history of Parkinson's and Lewy body dementia. Pt currently lives with his wife who provides 24/7 care.  Clinical Impression  Pt was seen for evaluation of his mobility and then up to chair.  Has recently been to SNF and would benefit from return as he can go home with wife to care for him afterward.  Will anticipate not needing equipment as he has RW and wc, likely won't need any other items.    Follow Up Recommendations SNF    Equipment Recommendations  None recommended by PT    Recommendations for Other Services Rehab consult     Precautions / Restrictions Precautions Precautions: Fall (foley cath) Restrictions Weight Bearing Restrictions: No      Mobility  Bed Mobility Overal bed mobility: Needs Assistance Bed Mobility: Supine to Sit     Supine to sit: Min assist     General bed mobility comments: Pt follows tactile cues well  Transfers Overall transfer level: Needs assistance Equipment used: Rolling walker (2 wheeled);1 person hand held assist Transfers: Sit to/from [facility] Sit to Stand: Mod assist;Min guard;Min assist (variable depending on his willingness to follow) Stand pivot transfers: Min assist;Mod assist (Depending on following cues)          Ambulation/Gait Ambulation/Gait assistance: Mod assist;Min assist Ambulation Distance (Feet): 3 Feet Assistive device: Rolling walker (2 wheeled);1 person hand held assist (2 would be helpful) Gait Pattern/deviations: Step-to pattern;Decreased step length - right;Decreased step length - left;Shuffle;Narrow base of support;Trunk flexed Gait velocity: slow Gait velocity interpretation: Below normal speed for age/gender General Gait Details: lists to L  side  Stairs            Wheelchair Mobility    Modified Rankin (Stroke Patients Only)       Balance                                             Pertinent Vitals/Pain Pain Assessment: No/denies pain    Home Living Family/patient expects to be discharged to:: Skilled nursing facility                      Prior Function Level of Independence: Needs assistance   Gait / Transfers Assistance Needed: RW until UTI then was wc  ADL's / Homemaking Assistance Needed: wife cares for house        Hand Dominance        Extremity/Trunk Assessment   Upper Extremity Assessment: Overall WFL for tasks assessed           Lower Extremity Assessment: Overall WFL for tasks assessed      Cervical / Trunk Assessment: Normal  Communication   Communication: Expressive difficulties  Cognition Arousal/Alertness: Lethargic Behavior During Therapy: Flat affect Overall Cognitive Status: History of cognitive impairments - at baseline       Memory: Decreased recall of precautions;Decreased short-term memory              General Comments General comments (skin integrity, edema, etc.): Wife is caregiver and staying with him 24/7    Exercises        Assessment/Plan    PT Assessment Patient  needs continued PT services  PT Diagnosis Abnormality of gait   PT Problem List Decreased range of motion;Decreased activity tolerance;Decreased balance;Decreased mobility;Decreased coordination;Decreased cognition;Decreased knowledge of use of DME;Decreased safety awareness  PT Treatment Interventions DME instruction;Gait training;Functional mobility training;Therapeutic activities;Therapeutic exercise;Balance training;Neuromuscular re-education;Patient/family education   PT Goals (Current goals can be found in the Care Plan section) Acute Rehab PT Goals Patient Stated Goal: none PT Goal Formulation: With family Time For Goal Achievement:  04/29/15 Potential to Achieve Goals: Good    Frequency Min 2X/week   Barriers to discharge Other (comment) (will need 2 person assist)      Co-evaluation               End of Session Equipment Utilized During Treatment: Gait belt Activity Tolerance: Patient tolerated treatment well;Patient limited by lethargy Patient left: in chair;with call bell/phone within reach;with chair alarm set;with family/visitor present;with nursing/sitter in room Nurse Communication: Mobility status         Time: [id number] PT Time Calculation (min) (ACUTE ONLY): 27 min   Charges:   PT Evaluation $PT Eval Low Complexity: 1 Procedure PT Treatments $Gait Training: 8-22 mins   PT G CodesRamond Dial 2015/04/24, 11:56 AM   Mee Hives, PT MS Acute Rehab Dept. Number: [facility] [phone] and [place] [phone]

## 2015-04-15 NOTE — Clinical Social Work Note (Signed)
Clinical Social Work Assessment  Patient Details  Name: Shane Mooney. MRN: [id number] Date of Birth: 05-13-1940  Date of referral:  04/15/15               Reason for consult:  Insurance Barriers, Facility Placement, [employer], Discharge Planning                Permission sought to share information with:  Case Optician, dispensing granted to share information::  Yes, Verbal Permission Granted  Name::        Agency::     Relationship::     Contact Information:     Housing/Transportation Living arrangements for the past 2 months:  [place], [place] of Information:  Spouse Patient Interpreter Needed:  None Criminal Activity/Legal Involvement Pertinent to Current Situation/Hospitalization:  No - Comment as needed Significant Relationships:  Adult Children, Spouse Lives with:  Spouse Do you feel safe going back to the place where you live?  Yes Need for family participation in patient care:  Yes (Comment)  Care giving concerns:  Patient lives in [place] with his wife [name] [phone].    Social Worker assessment / plan:  Holiday representative (CSW) received consult that "family can't take care of patient at home." CSW met with patient and his wife [name] was at bedside. CSW introduced self and explained role of CSW department. Patient was laying in bed holding a doll. Wife reported that patient went to [facility] for rehab Dec. 21st 2016 through Jan.10th 2017. Per wife patient will have hired private duty caregivers starting Monday 04/16/15. Per wife caregivers will come in from 8 am to 1 pm 3 days a week. Per wife patient's daughter Erline Levine will provide care for patient also during the week. Per wife they have locks on the door with dead bolts, water heater cannot go up past 110 degrees, and they utilize [employer] [facility] in [place]. Per wife she wants to keep patient home as long as she can. CSW explained that patient  is currently under Medicare Observation, which means that Medicare will not pay for short term rehab at [facility]. Wife is not interested in rehab or placement at this time. Wife reported that the plan is for patient to return home. Per wife patient also has PT and OT coming out to the house through March. CSW also discussed long term care Private Pay VS. Medicaid. Per wife they will not qualify for Medicaid. CSW provided ALF list. RN Case Manager is aware of above. Please reconsult if future social work needs arise. CSW signing off.   Employment status:  Disabled (Comment on whether or not currently receiving Disability), Retired Forensic scientist:  Medicare PT Recommendations:  Not assessed at this time Information / Referral to community resources:  Other (Comment Required) Librarian, academic)  Patient/Family's Response to care:  Wife is agreeable to take patient home.   Patient/Family's Understanding of and Emotional Response to Diagnosis, Current Treatment, and Prognosis:  Wife was pleasant throughout assessment.   Emotional Assessment Appearance:  Appears stated age Attitude/Demeanor/Rapport:    Affect (typically observed):  Pleasant Orientation:  Fluctuating Orientation (Suspected and/or reported Sundowners), Oriented to Self Alcohol / Substance use:  Not Applicable Psych involvement (Current and /or in the community):  No (Comment)  Discharge Needs  Concerns to be addressed:  Discharge Planning Concerns Readmission within the last 30 days:  Yes Current discharge risk:  Cognitively Impaired Barriers to Discharge:  Continued Medical  Work up   [employer], LCSW 04/15/2015, 10:40 AM

## 2015-04-15 NOTE — Progress Notes (Signed)
No output since 1427 04/14/15.  Called Dr Jannifer Franklin and received order for foley cath.  Inserted with sterile technique 16 F foley cath.  Urine output of 1000 cc cloudy , foul smelling urine. Pt tolerated well. Dorna Bloom RN

## 2015-04-15 NOTE — Plan of Care (Signed)
Problem: Education: Goal: Knowledge of [facility] Education information/materials will improve Outcome: Not Met (add Reason) Pt with dementia  Problem: Safety: Goal: Ability to remain free from injury will improve Outcome: Progressing Pt high fall risk.  Using bed alarm, room close to nurses station and frequent rounds to assure safety.

## 2015-04-15 NOTE — Care Management Obs Status (Signed)
[place] NOTIFICATION   Patient Details  Name: Shane Mooney. MRN: [id number] Date of Birth: Jan 31, 1941   Medicare Observation Status Notification Given:  Yes      Reviewed with wife and wife verbalized understanding of form and signed it per [name] has dementia.     Katarina Riebe A, RN 04/15/2015, 2:32 PM

## 2015-04-16 ENCOUNTER — Telehealth: Payer: Self-pay

## 2015-04-16 ENCOUNTER — Ambulatory Visit: Payer: Medicare Other

## 2015-04-16 ENCOUNTER — Telehealth: Payer: Self-pay | Admitting: Neurology

## 2015-04-16 DIAGNOSIS — R4182 Altered mental status, unspecified: Secondary | ICD-10-CM | POA: Diagnosis not present

## 2015-04-16 DIAGNOSIS — G3183 Dementia with Lewy bodies: Secondary | ICD-10-CM | POA: Diagnosis not present

## 2015-04-16 DIAGNOSIS — F0281 Dementia in other diseases classified elsewhere with behavioral disturbance: Secondary | ICD-10-CM

## 2015-04-16 DIAGNOSIS — R410 Disorientation, unspecified: Secondary | ICD-10-CM

## 2015-04-16 MED ORDER — QUETIAPINE FUMARATE 25 MG PO TABS: 25.0000 mg | ORAL_TABLET | Freq: Three times a day (TID) | ORAL | Status: DC

## 2015-04-16 NOTE — Progress Notes (Signed)
   04/16/15 1320  Clinical Encounter Type  Visited With Patient and family together  Visit Type Initial  Consult/Referral To Chaplain  Spiritual Encounters  Spiritual Needs Emotional  Stress Factors  Patient Stress Factors Exhausted;Health changes  Family Stress Factors Family relationships;Health changes;Major life changes  Met w/patient & family. Patient was sleeping. Provided pastoral care to family. Chap. Jong Rickman G. Franklin

## 2015-04-16 NOTE — Progress Notes (Signed)
Pt is alert and calm, but confused.  He's afebrile - no apparent source of infection.  MRI shows no acute infarct.  Pt being tx for UTI.  He has urinary retention but can't have foley b/c he pulls them out b/c of dementia.  I/out caths being performed.  Going home w/wife and daughter.  Family doesn't want placement. They have home health aids.  Reviewed d/c paperwork w/wife and daughter.  Pt had pulled out IV before shift change. Pt being wheeled out by nurse tech.

## 2015-04-16 NOTE — Final Progress Note (Signed)
[place] NAME: Shane Mooney    MR#:  [id number]  DATE OF BIRTH:  [birth year]  DATE OF ADMISSION:  04/14/2015 ADMITTING PHYSICIAN: Idelle Crouch, MD  DATE OF DISCHARGE: 04/16/2015  PRIMARY CARE PHYSICIAN: Loistine Chance, MD    ADMISSION DIAGNOSIS:  Confusion state [F44.89] Altered mental status, unspecified altered mental status type [R41.82]  DISCHARGE DIAGNOSIS:  Principal Problem:   Altered mental status Active Problems:   Chronic prostatitis   Dementia with Lewy bodies   Parkinson's disease ([place])   Urinary retention   Confusion state  No clear reason for his confusion, May be it was due to medications.  SECONDARY DIAGNOSIS:   Past Medical History  Diagnosis Date  . Ulcer   . Hernia     LIH  . 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 Cancer ([place]) 2013    basal cell carcinoma back and face  . Personal history of colonic polyps   . Parkinson disease [facility]) April 2014  . Dementia     HOSPITAL COURSE:   * Confusion and altered mental status As per wife this is sudden onset started the day before admisison. There is no source of infection. Patient was recently treated for UTI and was followed by urologist and was advised to have in and out catheter because he was trying to pull out his Foley catheter due to his dementia. His urinalysis, chest x-ray, blood cultures are all negative. Urine cx is negative. negative MRI  for any stroke. Completely alert and calm today, as as per his wife- this is his baseline.  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 2 frequent bladder scan and do in and out catheter as needed. He is at high risk of pulling out the catheter so will DC Foley.  * Parkinson's disease Continue medication.  * Dementia Continue medication. He had home health set up.  DISCHARGE CONDITIONS:   Stable.  CONSULTS OBTAINED:   Treatment Team:  Alexis Goodell, MD  DRUG ALLERGIES:  No Known Allergies  DISCHARGE MEDICATIONS:   Current Discharge Medication List    CONTINUE these medications which have NOT CHANGED   Details  carbidopa-levodopa (SINEMET IR) 25-100 MG tablet Take 0.5 tablets by mouth 3 (three) times daily. Qty: 45 tablet, Refills: 5    Cholecalciferol 2000 units TABS Take 2,000 Units by mouth daily.    Cyanocobalamin (VITAMIN B-12 PO) Take 1,000 mcg by mouth daily.    QUEtiapine (SEROQUEL) 25 MG tablet 1 tablet in the morning, 1/2 tablet in the afternoon, 1 at night. Patient can take an extra 1/2 tablet daily as needed Qty: 90 tablet, Refills: 5    rivastigmine (EXELON) 9.5 mg/24hr Place 1 patch (9.5 mg total) onto the skin daily. Qty: 30 patch, Refills: 5    sulfamethoxazole-trimethoprim (BACTRIM DS) 800-160 MG tablet Take 1 tablet by mouth 2 (two) times daily. Qty: 20 tablet, Refills: 0    tamsulosin (FLOMAX) 0.4 MG CAPS capsule Take 1 capsule (0.4 mg total) by mouth at bedtime. Qty: 30 capsule, Refills: 11   Associated Diagnoses: Urinary retention    traZODone (DESYREL) 50 MG tablet Take 100 mg by mouth at bedtime.  Refills: 0         DISCHARGE INSTRUCTIONS:    Follow with PMD in 1 week, and with urologist and neurologist in 2-4 weeks.  If you experience worsening of your admission symptoms, develop shortness  of breath, life threatening emergency, suicidal or homicidal thoughts you must seek medical attention immediately by calling 911 or calling your MD immediately  if symptoms less severe.  You Must read complete instructions/literature along with all the possible adverse reactions/side effects for all the Medicines you take and that have been prescribed to you. Take any new Medicines after you have completely understood and accept all the possible adverse reactions/side effects.   Please note  You were cared for by a hospitalist during your hospital stay. If you have any  questions about your discharge medications or the care you received while you were in the hospital after you are discharged, you can call the unit and asked to speak with the hospitalist on call if the hospitalist that took care of you is not available. Once you are discharged, your primary care physician will handle any further medical issues. Please note that NO REFILLS for any discharge medications will be authorized once you are discharged, as it is imperative that you return to your primary care physician (or establish a relationship with a primary care physician if you do not have one) for your aftercare needs so that they can reassess your need for medications and monitor your lab values.    Today   CHIEF COMPLAINT:   Chief Complaint  Patient presents with  . Altered Mental Status    per family pt is more altered today    HISTORY OF PRESENT ILLNESS:  Shane Mooney  is a 75 y.o. male with a known history of Parkinson's with lewey body dementia as well as urinary retention with recurrent UTI's here with worsening confusion. Family unable to care for patient at home. In ER, work up unremarkable but patient remains severely confused and incapacitated. He is now admitted.  Review of Systems: The family denies anorexia, fever, weight loss,, vision loss, hoarseness, chest pain, syncope, dyspnea on exertion, peripheral edema, balance deficits, hemoptysis, abdominal pain, melena, hematochezia, severe indigestion/heartburn, hematuria, incontinence, genital sores, muscle weakness, suspicious skin lesions, transient blindness, depression, unusual weight change, abnormal bleeding, enlarged lymph nodes, angioedema, and breast masses.    VITAL SIGNS:  Blood pressure 142/76, pulse 68, temperature 97.4 F (36.3 C), temperature source Oral, resp. rate 18, height 5\' 9"  (1.753 m), weight 69.718 kg (153 lb 11.2 oz), SpO2 100 %.  I/O:   Intake/Output Summary (Last 24 hours) at 04/16/15 1156 Last data  filed at 04/16/15 0800  Gross per 24 hour  Intake    120 ml  Output   1150 ml  Net  -1030 ml    PHYSICAL EXAMINATION:   GENERAL: 75 y.o.-year-old patient lying in the bed with no acute distress.  EYES: Pupils equal, round, reactive to light . No scleral icterus. Extraocular muscles intact.  HEENT: Head atraumatic, normocephalic. Oropharynx and nasopharynx clear.  NECK: Supple, no jugular venous distention. No thyroid enlargement, no tenderness.  LUNGS: Normal breath sounds bilaterally, no wheezing, rales,rhonchi or crepitation. No use of accessory muscles of respiration.  CARDIOVASCULAR: S1, S2 normal. No murmurs, rubs, or gallops.  ABDOMEN: Soft, nontender, nondistended. Bowel sounds present. No organomegaly or mass.  EXTREMITIES: No pedal edema, cyanosis, or clubbing.  NEUROLOGIC: Patient is alert, moves limbs on his own.  PSYCHIATRIC: The patient is alert, confused.  SKIN: No obvious rash, lesion, or ulcer.   DATA REVIEW:   CBC  Recent Labs Lab 04/15/15 0600  WBC 6.6  HGB 12.9*  HCT 37.6*  PLT 184    Chemistries   Recent  Labs Lab 04/15/15 0600  NA 139  K 3.9  CL 107  CO2 29  GLUCOSE 94  BUN 14  CREATININE 0.85  CALCIUM 8.9  AST 20  ALT 19  ALKPHOS 62  BILITOT 2.2*    Cardiac Enzymes  Recent Labs Lab 04/14/15 1207  TROPONINI <0.03    Microbiology Results  Results for orders placed or performed during the hospital encounter of 04/14/15  Urine culture     Status: None   Collection Time: 04/14/15  2:12 PM  Result Value Ref Range Status   Specimen Description URINE, RANDOM  Final   Special Requests NONE  Final   Culture MULTIPLE SPECIES PRESENT, SUGGEST RECOLLECTION  Final   Report Status 04/15/2015 FINAL  Final    RADIOLOGY:  Dg Chest 1 View  04/14/2015  CLINICAL DATA:  Altered mental status EXAM: CHEST 1 VIEW COMPARISON:  03/10/2015 FINDINGS: Cardiomediastinal silhouette is stable. No acute infiltrate or pleural effusion. No  pulmonary edema. Mild degenerative changes bilateral shoulders. IMPRESSION: No active disease. Electronically Signed   By: Lahoma Crocker M.D.   On: 04/14/2015 13:11   Ct Head Wo Contrast  04/14/2015  CLINICAL DATA:  Increasing somnolence. Patient is being treated for UTI. EXAM: CT HEAD WITHOUT CONTRAST TECHNIQUE: Contiguous axial images were obtained from the base of the skull through the vertex without intravenous contrast. COMPARISON:  03/10/2015, head CT. FINDINGS: Brain: No evidence of acute infarction, hemorrhage, extra-axial collection, ventriculomegaly, or mass effect. There is moderate brain parenchymal atrophy and mild periventricular chronic small vessel disease changes. Remote Vascular: No hyperdense vessel or unexpected calcification. Skull: Negative for fracture or focal lesion. Sinuses/Orbits: No acute findings. Other: None. IMPRESSION: No acute intracranial abnormality. Atrophy, chronic microvascular disease. Electronically Signed   By: Fidela Salisbury M.D.   On: 04/14/2015 14:18   Mr Brain Wo Contrast  04/15/2015  CLINICAL DATA:  75 year old male with Parkinson's disease and Lewy body dementia presenting with worsening confusion. Subsequent encounter. EXAM: MRI HEAD WITHOUT CONTRAST TECHNIQUE: Multiplanar, multiecho pulse sequences of the brain and surrounding structures were obtained without intravenous contrast. COMPARISON:  04/14/2015 CT.  03/07/2015 MR. FINDINGS: Exam is motion degraded. No acute infarct or intracranial hemorrhage. Moderate global atrophy. Ventricular prominence stable felt to be related to atrophy. Moderate chronic small vessel disease type changes unchanged. Major intracranial vascular structures are patent. No obvious mass although evaluation limited by motion. Mild exophthalmos. Mild spinal stenosis C3-4 with slight cord flattening. Cervical medullary junction unremarkable. Small pituitary gland incidentally noted. IMPRESSION: Motion degraded exam without acute  infarct. Remainder of findings similar to prior exam as detailed above. Electronically Signed   By: Genia Del M.D.   On: 04/15/2015 15:48    EKG:   Orders placed or performed during the hospital encounter of 04/14/15  . ED EKG  . ED EKG  . EKG 12-Lead  . EKG 12-Lead      Management plans discussed with the patient, family and they are in agreement.  CODE STATUS:     Code Status Orders        Start     Ordered   04/14/15 1731  Do not attempt resuscitation (DNR)   Continuous    Question Answer Comment  In the event of cardiac or respiratory ARREST Do not call a "code blue"   In the event of cardiac or respiratory ARREST Do not perform Intubation, CPR, defibrillation or ACLS   In the event of cardiac or respiratory ARREST Use medication by any  route, position, wound care, and other measures to relive pain and suffering. May use oxygen, suction and manual treatment of airway obstruction as needed for comfort.      04/14/15 1730    Code Status History    Date Active Date Inactive Code Status Order ID Comments User Context   03/11/2015  1:19 PM 03/13/2015  6:17 PM DNR [id number]  Fritzi Mandes, MD Inpatient   03/10/2015  9:50 PM 03/11/2015  1:19 PM Full Code [id number]  Lytle Butte, MD ED   03/06/2015  4:31 PM 03/07/2015  8:16 PM Full Code [id number]  Hillary Bow, MD ED    Advance Directive Documentation        Most Recent Value   Type of Advance Directive  Out of facility DNR (pink MOST or yellow form), Healthcare Power of Attorney   Pre-existing out of facility DNR order (yellow form or pink MOST form)     "MOST" Form in Place?        TOTAL TIME TAKING CARE OF THIS PATIENT: 35 minutes.    Vaughan Basta M.D on 04/16/2015 at 11:56 AM  Between 7am to 6pm - Pager - [phone]  After 6pm go to [email or web address] - password [email or web address] [facility]  Office  [phone]  CC: Primary care physician; Loistine Chance, MD   Note: This dictation  was prepared with Dragon dictation along with smaller phrase technology. Any transcriptional errors that result from this process are unintentional.

## 2015-04-16 NOTE — Telephone Encounter (Signed)
OT therapist called to get continuation orders for OT. Verbal order given.

## 2015-04-16 NOTE — Telephone Encounter (Signed)
Received a notice from after hours triage line that pt wife called in for assistance. After hours triage advised wife to take pt to the ER.  Spoke with pt wife in reference to pt and ER. Wife stated pt has severe altered mental status and bit the tip off a mercury thermometer.  Per wife pt was checked for a UTI and results were negative. Per wife the problem lies outside of the urological system. Wife did want Dr. Erlene Quan made aware.

## 2015-04-16 NOTE — Consult Note (Signed)
Reason for Consult:Altered mental status Referring Physician: Molli Hazard  CC: Confusion  HPI: Shane Mooney. is an 75 y.o. male with a history of PD with Lewy body Dementia.  At baseline was ambulatory, able to dress himself, and bathe himself.  Was able to feed himself but did hallucinate.  Was unable to recognize his wife of over 50 years at times.  Did not handle any finances or drive.  At the end of last year developed urinary retention due to BPH with increase in creatinine.  Patient cognitively declined at that time.  Has not returned to his previous baseline.  After that resolved was found to have a UTI.  Has been home since the 10th and the family has had further decline.  Wife is intermittently catheterizing the patient.  He is becoming less cooperative with this.  On the day of admission his wife found him to be incoherent.  She felt that he may have had a stroke and brought him in for evaluation.    Past Medical History  Diagnosis Date  . Ulcer   . Hernia     LIH  . 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 Cancer ([place]) 2013    basal cell carcinoma back and face  . Personal history of colonic polyps   . Parkinson disease [facility]) April 2014  . Dementia     Past Surgical History  Procedure Laterality Date  . Colonoscopy  2012    Dr Bary Castilla  . Hernia repair  2000    [facility]  . Repair of perforated ulcer  1994  . Cholecystectomy  2004  . Hemicolectomy Right 08/2011  . Skin cancer excision Left 2013    neck  . Polypectomy  2006     benign    Family History  Problem Relation Age of Onset  . Diabetes Neg Hx   . Bladder Cancer Neg Hx   . Prostate cancer Neg Hx   . Kidney cancer Neg Hx     Social History:  reports that he quit smoking about 40 years ago. His smoking use included Cigarettes. He has a 5 pack-year smoking history. He has never used smokeless tobacco. He reports that he does not drink alcohol or use illicit drugs.  No Known Allergies  Medications:   I have reviewed the patient's current medications. Prior to Admission:  Prescriptions prior to admission  Medication Sig Dispense Refill Last Dose  . carbidopa-levodopa (SINEMET IR) 25-100 MG tablet Take 0.5 tablets by mouth 3 (three) times daily. (Patient taking differently: Take 0.5-1 tablets by mouth See admin instructions. Take 1/2 tablet by mouth every morning. Take 1 tablet by mouth at noon and evening.) 45 tablet 5 04/13/2015 at Unknown time  . Cholecalciferol 2000 units TABS Take 2,000 Units by mouth daily.   04/13/2015 at Unknown time  . Cyanocobalamin (VITAMIN B-12 PO) Take 1,000 mcg by mouth daily.   04/13/2015 at Unknown time  . QUEtiapine (SEROQUEL) 25 MG tablet 1 tablet in the morning, 1/2 tablet in the afternoon, 1 at night. Patient can take an extra 1/2 tablet daily as needed 90 tablet 5 04/13/2015 at Unknown time  . rivastigmine (EXELON) 9.5 mg/24hr Place 1 patch (9.5 mg total) onto the skin daily. 30 patch 5 04/13/2015 at Unknown time  . sulfamethoxazole-trimethoprim (BACTRIM DS) 800-160 MG tablet Take 1 tablet by mouth 2 (two) times daily. 20 tablet 0 04/13/2015 at Unknown time  . tamsulosin (FLOMAX) 0.4 MG CAPS capsule Take 1 capsule (  0.4 mg total) by mouth at bedtime. 30 capsule 11 04/13/2015 at Unknown time  . traZODone (DESYREL) 100 MG tablet Take 1 tablet (100 mg total) by mouth at bedtime. 30 tablet 2 haven't started  . traZODone (DESYREL) 50 MG tablet Take 100 mg by mouth at bedtime.   0 04/13/2015 at Unknown time   Scheduled: . carbidopa-levodopa  0.5 tablet Oral q morning - 10a  . carbidopa-levodopa  1 tablet Oral BID AC  . cefTRIAXone (ROCEPHIN)  IV  1 g Intravenous Q24H  . cholecalciferol  2,000 Units Oral Daily  . docusate sodium  100 mg Oral BID  . heparin  5,000 Units Subcutaneous 3 times per day  . QUEtiapine  25 mg Oral BID  . rivastigmine  9.5 mg Transdermal Daily  . tamsulosin  0.4 mg Oral QHS  . traZODone  100 mg Oral QHS  . vitamin B-12  1,000 mcg Oral Daily     ROS: History obtained from wife  General ROS: weight loss Psychological ROS: as noted in HPI Ophthalmic ROS: negative for - blurry vision, double vision, eye pain or loss of vision ENT ROS: negative for - epistaxis, nasal discharge, oral lesions, sore throat, tinnitus or vertigo Allergy and Immunology ROS: negative for - hives or itchy/watery eyes Hematological and Lymphatic ROS: negative for - bleeding problems, bruising or swollen lymph nodes Endocrine ROS: negative for - galactorrhea, hair pattern changes, polydipsia/polyuria or temperature intolerance Respiratory ROS: negative for - cough, hemoptysis, shortness of breath or wheezing Cardiovascular ROS: negative for - chest pain, dyspnea on exertion, edema or irregular heartbeat Gastrointestinal ROS: stool incontinence Genito-Urinary ROS: as noted in HPI Musculoskeletal ROS: negative for - joint swelling or muscular weakness Neurological ROS: as noted in HPI Dermatological ROS: negative for rash and skin lesion changes  Physical Examination: Blood pressure 142/76, pulse 68, temperature 97.4 F (36.3 C), temperature source Oral, resp. rate 18, height 5\' 9"  (1.753 m), weight 69.718 kg (153 lb 11.2 oz), SpO2 100 %.  HEENT-  Normocephalic, no lesions, without obvious abnormality.  Normal external eye and conjunctiva.  Normal TM's bilaterally.  Normal auditory canals and external ears. Normal external nose, mucus membranes and septum.  Normal pharynx. Cardiovascular- S1, S2 normal, pulses palpable throughout   Lungs- chest clear, no wheezing, rales, normal symmetric air entry Abdomen- soft, non-tender; bowel sounds normal; no masses,  no organomegaly Extremities- no edema Lymph-no adenopathy palpable Musculoskeletal-no joint tenderness, deformity or swelling Skin-warm and dry, no hyperpigmentation, vitiligo, or suspicious lesions  Neurological Examination Mental Status: Alert.  Will not cooperate with examination.  Responses are  not coherent.  At times looking up at the ceiling as if something is there-suspect hallucinations.  Does not follow commands. Cranial Nerves: II: Patient will not cooperate for discs to be evaluated.  Blinks to bilateral confrontation.  Pupils equal, round, reactive to light and accommodation III,IV, VI: left ptosis, extra-ocular motions intact bilaterally V,VII: grimace symmetric VIII: unable to test IX,X: gag reflex present XI: bilateral shoulder shrug XII: unable to test Motor: Moves all extremities spontaneously against gravity with no focal weakness appreciated.   Sensory: Withdraws from noxious stimuli bilaterally Deep Tendon Reflexes: 2+ and symmetric throughout Plantars: Right: downgoing   Left: downgoing Cerebellar: Unable to perform Gait: not tested due to safety concerns   Laboratory Studies:   Basic Metabolic Panel:  Recent Labs Lab 04/14/15 1207 04/15/15 0600  NA 137 139  K 4.4 3.9  CL 102 107  CO2 27 29  GLUCOSE 113*  94  BUN 19 14  CREATININE 0.98 0.85  CALCIUM 9.5 8.9    Liver Function Tests:  Recent Labs Lab 04/14/15 1207 04/15/15 0600  AST 18 20  ALT 20 19  ALKPHOS 65 62  BILITOT 2.0* 2.2*  PROT 6.7 6.1*  ALBUMIN 4.0 3.5    Recent Labs Lab 04/14/15 1207  LIPASE 23   No results for input(s): AMMONIA in the last 168 hours.  CBC:  Recent Labs Lab 04/14/15 1207 04/15/15 0600  WBC 9.7 6.6  NEUTROABS 8.2*  --   HGB 12.9* 12.9*  HCT 37.7* 37.6*  MCV 89.8 91.5  PLT 203 184    Cardiac Enzymes:  Recent Labs Lab 04/14/15 1207  TROPONINI <0.03    BNP: Invalid input(s): POCBNP  CBG:  Recent Labs Lab 04/15/15 0712 04/15/15 1132  GLUCAP 90 105*    Microbiology: Results for orders placed or performed during the hospital encounter of 04/14/15  Urine culture     Status: None   Collection Time: 04/14/15  2:12 PM  Result Value Ref Range Status   Specimen Description URINE, RANDOM  Final   Special Requests NONE  Final    Culture MULTIPLE SPECIES PRESENT, SUGGEST RECOLLECTION  Final   Report Status 04/15/2015 FINAL  Final    Coagulation Studies: No results for input(s): LABPROT, INR in the last 72 hours.  Urinalysis:  Recent Labs Lab 04/14/15 1412  COLORURINE YELLOW*  LABSPEC 1.017  PHURINE 5.0  GLUCOSEU NEGATIVE  HGBUR NEGATIVE  BILIRUBINUR NEGATIVE  KETONESUR NEGATIVE  PROTEINUR NEGATIVE  NITRITE NEGATIVE  LEUKOCYTESUR NEGATIVE    Lipid Panel:  No results found for: CHOL, TRIG, HDL, CHOLHDL, VLDL, LDLCALC  HgbA1C: No results found for: HGBA1C  Urine Drug Screen:  No results found for: LABOPIA, COCAINSCRNUR, LABBENZ, AMPHETMU, THCU, LABBARB  Alcohol Level: No results for input(s): ETH in the last 168 hours.  Other results: EKG: sinus rhythm at 71 bpm.  Imaging: Dg Chest 1 View  04/14/2015  CLINICAL DATA:  Altered mental status EXAM: CHEST 1 VIEW COMPARISON:  03/10/2015 FINDINGS: Cardiomediastinal silhouette is stable. No acute infiltrate or pleural effusion. No pulmonary edema. Mild degenerative changes bilateral shoulders. IMPRESSION: No active disease. Electronically Signed   By: Lahoma Crocker M.D.   On: 04/14/2015 13:11   Ct Head Wo Contrast  04/14/2015  CLINICAL DATA:  Increasing somnolence. Patient is being treated for UTI. EXAM: CT HEAD WITHOUT CONTRAST TECHNIQUE: Contiguous axial images were obtained from the base of the skull through the vertex without intravenous contrast. COMPARISON:  03/10/2015, head CT. FINDINGS: Brain: No evidence of acute infarction, hemorrhage, extra-axial collection, ventriculomegaly, or mass effect. There is moderate brain parenchymal atrophy and mild periventricular chronic small vessel disease changes. Remote Vascular: No hyperdense vessel or unexpected calcification. Skull: Negative for fracture or focal lesion. Sinuses/Orbits: No acute findings. Other: None. IMPRESSION: No acute intracranial abnormality. Atrophy, chronic microvascular disease. Electronically  Signed   By: Fidela Salisbury M.D.   On: 04/14/2015 14:18   Mr Brain Wo Contrast  04/15/2015  CLINICAL DATA:  75 year old male with Parkinson's disease and Lewy body dementia presenting with worsening confusion. Subsequent encounter. EXAM: MRI HEAD WITHOUT CONTRAST TECHNIQUE: Multiplanar, multiecho pulse sequences of the brain and surrounding structures were obtained without intravenous contrast. COMPARISON:  04/14/2015 CT.  03/07/2015 MR. FINDINGS: Exam is motion degraded. No acute infarct or intracranial hemorrhage. Moderate global atrophy. Ventricular prominence stable felt to be related to atrophy. Moderate chronic small vessel disease type changes unchanged. Major intracranial  vascular structures are patent. No obvious mass although evaluation limited by motion. Mild exophthalmos. Mild spinal stenosis C3-4 with slight cord flattening. Cervical medullary junction unremarkable. Small pituitary gland incidentally noted. IMPRESSION: Motion degraded exam without acute infarct. Remainder of findings similar to prior exam as detailed above. Electronically Signed   By: Genia Del M.D.   On: 04/15/2015 15:48     Assessment/Plan: 75 year old male with a history of Lewy Body Dementia.  Recently patient has had a cognitive decline.  Multiple medical issues have been present as well but currently appear to have been addressed.  MRI of the brain personally reviewed and shows no acute findings.  Atrophy noted.  It is not unusual for patient's with underlying cognitive issues to have further decline when medical issues ensue.  Would not expect him to go back to baseline.    Recommendations: 1.  Increase Quetiapine to 25mg  TID  Alexis Goodell, MD Neurology [phone] 04/16/2015, 10:52 AM

## 2015-04-16 NOTE — Plan of Care (Signed)
Problem: Safety: Goal: Ability to remain free from injury will improve Outcome: Progressing Confused. In and out cath, 625 mls output. No c/o pain nor distress noted. On iv fluids. Continue to monitor.

## 2015-04-16 NOTE — Care Management (Signed)
Spoke with Ms. Rufe Rutkowski (wife) at the bedside. Next appointment with Dr. Joeseph Amor is scheduled for tomorrow 2015-04-23. Would like services thru [facility] to be resumed when discharged. States there are hired caregivers from 8:00am-1:00pm 3 days a week. Ms. Donia has been taught In & Out catherization. Wife will transport. Possible discharge today. Shelbie Ammons RN MSN CCM Care Management [phone]

## 2015-04-17 ENCOUNTER — Ambulatory Visit: Payer: Medicare Other | Admitting: Family Medicine

## 2015-04-18 ENCOUNTER — Ambulatory Visit: Payer: Medicare Other | Admitting: Physician Assistant

## 2015-04-18 ENCOUNTER — Ambulatory Visit: Payer: Medicare Other

## 2015-04-20 ENCOUNTER — Ambulatory Visit: Payer: Medicare Other | Admitting: Urology

## 2015-04-25 NOTE — Progress Notes (Signed)
Late entry for missed G code.   05/01/15 1101  PT G-Codes **NOT FOR INPATIENT CLASS**  Functional Assessment Tool Used clinical judgment  Functional Limitation Mobility: Walking and moving around  Mobility: Walking and Moving Around Current Status [phone]) CK  Mobility: Walking and Moving Around Goal Status [phone]) CK  Mee Hives, PT MS Acute Rehab Dept. Number: [facility] [id number] and [place] [phone]

## 2015-04-25 DEATH — deceased

## 2015-04-29 NOTE — Discharge Summary (Signed)
[facility] NAME: Shane Mooney   MR#: [id number]  DATE OF BIRTH: Jul 22, 1940  DATE OF ADMISSION: 1/21/2017ADMITTING PHYSICIAN: Idelle Crouch, MD  DATE OF DISCHARGE: 04/16/2015  PRIMARY CARE PHYSICIAN: Loistine Chance, MD    ADMISSION DIAGNOSIS:   Admission diagnosis  Confusion state [F44.89] Altered mental status, unspecified altered mental status type [R41.82]  DISCdischarge HARGE DIAGNOSIS:   Discharge diagnosis Principal Problem:  Altered mental status Active Problems:  Chronic prostatitis  Dementia with Lewy bodies  Parkinson's disease ([place])  Urinary retention  Confusion state  No clear reason for his confusion, May be it was due to medications.  SECONDARY DIAGNOSIS:   Past Medical History  Diagnosis Date  . Ulcer   . Hernia     LIH  . 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 Cancer ([place]) 2013    basal cell carcinoma back and face  . Personal history of colonic polyps   . Parkinson disease [facility]) April 2014  . Dementia     HOSPITAL COURSE:    Hospital course  * Confusion and altered mental status As per wife this is sudden onset started the day before admisison. There is no source of infection. Patient was recently treated for UTI and was followed by urologist and was advised to have in and out catheter because he was trying to pull out his Foley catheter due to his dementia. His urinalysis, chest x-ray, blood cultures are all negative. Urine cx is negative. negative MRI for any stroke. Completely alert and calm today, as as per his wife- this is his baseline.  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 2 frequent bladder scan and do in and out catheter as needed. He is at high risk of pulling out the catheter so will DC Foley.  * Parkinson's disease Continue  medication.  * Dementia Continue medication. He had home health set up.  DISCHARGE CONDITIONS:   Stable.  CONSULTS OBTAINED:  Treatment Team: Alexis Goodell, MD  DRUG ALLERGIES:  No Known Allergies  DISCHARGE MEDICATIONS:  Discharge meds   Current Discharge Medication List    CONTINUE these medications which have NOT CHANGED   Details  carbidopa-levodopa (SINEMET IR) 25-100 MG tablet Take 0.5 tablets by mouth 3 (three) times daily. Qty: 45 tablet, Refills: 5    Cholecalciferol 2000 units TABS Take 2,000 Units by mouth daily.    Cyanocobalamin (VITAMIN B-12 PO) Take 1,000 mcg by mouth daily.    QUEtiapine (SEROQUEL) 25 MG tablet 1 tablet in the morning, 1/2 tablet in the afternoon, 1 at night. Patient can take an extra 1/2 tablet daily as needed Qty: 90 tablet, Refills: 5    rivastigmine (EXELON) 9.5 mg/24hr Place 1 patch (9.5 mg total) onto the skin daily. Qty: 30 patch, Refills: 5    sulfamethoxazole-trimethoprim (BACTRIM DS) 800-160 MG tablet Take 1 tablet by mouth 2 (two) times daily. Qty: 20 tablet, Refills: 0    tamsulosin (FLOMAX) 0.4 MG CAPS capsule Take 1 capsule (0.4 mg total) by mouth at bedtime. Qty: 30 capsule, Refills: 11   Associated Diagnoses: Urinary retention    traZODone (DESYREL) 50 MG tablet Take 100 mg by mouth at bedtime.  Refills: 0         DISCHARGE INSTRUCTIONS:    Follow with PMD in 1 week, and with urologist and neurologist in 2-4 weeks.  If you experience worsening of your admission symptoms,  develop shortness of breath, life threatening emergency, suicidal or homicidal thoughts you must seek medical attention immediately by calling 911 or calling your MD immediately if symptoms less severe.  You Must read complete instructions/literature along with all the possible adverse reactions/side effects for all the Medicines you take and that have been prescribed to you. Take any new  Medicines after you have completely understood and accept all the possible adverse reactions/side effects.   Please note  You were cared for by a hospitalist during your hospital stay. If you have any questions about your discharge medications or the care you received while you were in the hospital after you are discharged, you can call the unit and asked to speak with the hospitalist on call if the hospitalist that took care of you is not available. Once you are discharged, your primary care physician will handle any further medical issues. Please note that NO REFILLS for any discharge medications will be authorized once you are discharged, as it is imperative that you return to your primary care physician (or establish a relationship with a primary care physician if you do not have one) for your aftercare needs so that they can reassess your need for medications and monitor your lab values.    Today   CHIEF COMPLAINT:   Chief Complaint  Patient presents with  . Altered Mental Status    per family pt is more altered today    HISTORY OF PRESENT ILLNESS:   Presentation  Shane Mooney is a 75 y.o. male with a known history of Parkinson's with lewey body dementia as well as urinary retention with recurrent UTI's here with worsening confusion. Family unable to care for patient at home. In ER, work up unremarkable but patient remains severely confused and incapacitated. He is now admitted.  Review of Systems: The family denies anorexia, fever, weight loss,, vision loss, hoarseness, chest pain, syncope, dyspnea on exertion, peripheral edema, balance deficits, hemoptysis, abdominal pain, melena, hematochezia, severe indigestion/heartburn, hematuria, incontinence, genital sores, muscle weakness, suspicious skin lesions, transient blindness, depression, unusual weight change, abnormal bleeding, enlarged lymph nodes, angioedema, and breast masses.    VITAL SIGNS:   Exam on day  of discharge  Blood pressure 142/76, pulse 68, temperature 97.4 F (36.3 C), temperature source Oral, resp. rate 18, height 5\' 9"  (1.753 m), weight 69.718 kg (153 lb 11.2 oz), SpO2 100 %.  I/O:   Intake/Output Summary (Last 24 hours) at 04/16/15 1156 Last data filed at 04/16/15 0800  Gross per 24 hour  Intake  120 ml  Output  1150 ml  Net -1030 ml    PHYSICAL EXAMINATION:   GENERAL: 75 y.o.-year-old patient lying in the bed with no acute distress.  EYES: Pupils equal, round, reactive to light . No scleral icterus. Extraocular muscles intact.  HEENT: Head atraumatic, normocephalic. Oropharynx and nasopharynx clear.  NECK: Supple, no jugular venous distention. No thyroid enlargement, no tenderness.  LUNGS: Normal breath sounds bilaterally, no wheezing, rales,rhonchi or crepitation. No use of accessory muscles of respiration.  CARDIOVASCULAR: S1, S2 normal. No murmurs, rubs, or gallops.  ABDOMEN: Soft, nontender, nondistended. Bowel sounds present. No organomegaly or mass.  EXTREMITIES: No pedal edema, cyanosis, or clubbing.  NEUROLOGIC: Patient is alert, moves limbs on his own.  PSYCHIATRIC: The patient is alert, confused.  SKIN: No obvious rash, lesion, or ulcer.   DATA REVIEW:   CBC  Last Labs      Recent Labs Lab 04/15/15 0600  WBC 6.6  HGB 12.9*  HCT 37.6*  PLT 184      Chemistries   Last Labs      Recent Labs Lab 04/15/15 0600  NA 139  K 3.9  CL 107  CO2 29  GLUCOSE 94  BUN 14  CREATININE 0.85  CALCIUM 8.9  AST 20  ALT 19  ALKPHOS 62  BILITOT 2.2*      Cardiac Enzymes  Last Labs      Recent Labs Lab 04/14/15 1207  TROPONINI <0.03      Microbiology Results  Results for orders placed or performed during the hospital encounter of 04/14/15  Urine culture Status: None   Collection Time: 04/14/15 2:12 PM  Result Value Ref Range Status   Specimen  Description URINE, RANDOM  Final   Special Requests NONE  Final   Culture MULTIPLE SPECIES PRESENT, SUGGEST RECOLLECTION  Final   Report Status 04/15/2015 FINAL  Final    RADIOLOGY:   Imaging Results (Last 48 hours)    Dg Chest 1 View  04/14/2015 CLINICAL DATA: Altered mental status EXAM: CHEST 1 VIEW COMPARISON: 03/10/2015 FINDINGS: Cardiomediastinal silhouette is stable. No acute infiltrate or pleural effusion. No pulmonary edema. Mild degenerative changes bilateral shoulders. IMPRESSION: No active disease. Electronically Signed By: Lahoma Crocker M.D. On: 04/14/2015 13:11   Ct Head Wo Contrast  04/14/2015 CLINICAL DATA: Increasing somnolence. Patient is being treated for UTI. EXAM: CT HEAD WITHOUT CONTRAST TECHNIQUE: Contiguous axial images were obtained from the base of the skull through the vertex without intravenous contrast. COMPARISON: 03/10/2015, head CT. FINDINGS: Brain: No evidence of acute infarction, hemorrhage, extra-axial collection, ventriculomegaly, or mass effect. There is moderate brain parenchymal atrophy and mild periventricular chronic small vessel disease changes. Remote Vascular: No hyperdense vessel or unexpected calcification. Skull: Negative for fracture or focal lesion. Sinuses/Orbits: No acute findings. Other: None. IMPRESSION: No acute intracranial abnormality. Atrophy, chronic microvascular disease. Electronically Signed By: Fidela Salisbury M.D. On: 04/14/2015 14:18   Mr Brain Wo Contrast  04/15/2015 CLINICAL DATA: 75 year old male with Parkinson's disease and Lewy body dementia presenting with worsening confusion. Subsequent encounter. EXAM: MRI HEAD WITHOUT CONTRAST TECHNIQUE: Multiplanar, multiecho pulse sequences of the brain and surrounding structures were obtained without intravenous contrast. COMPARISON: 04/14/2015 CT. 03/07/2015 MR. FINDINGS: Exam is motion degraded. No acute infarct or intracranial hemorrhage. Moderate  global atrophy. Ventricular prominence stable felt to be related to atrophy. Moderate chronic small vessel disease type changes unchanged. Major intracranial vascular structures are patent. No obvious mass although evaluation limited by motion. Mild exophthalmos. Mild spinal stenosis C3-4 with slight cord flattening. Cervical medullary junction unremarkable. Small pituitary gland incidentally noted. IMPRESSION: Motion degraded exam without acute infarct. Remainder of findings similar to prior exam as detailed above. Electronically Signed By: Genia Del M.D. On: 04/15/2015 15:48     EKG:   Orders placed or performed during the hospital encounter of 04/14/15  . ED EKG  . ED EKG  . EKG 12-Lead  . EKG 12-Lead      Management plans discussed with the patient, family and they are in agreement.  CODE STATUS:     Code Status Orders        Start   Ordered   04/14/15 1731  Do not attempt resuscitation (DNR) Continuous   Question Answer Comment  In the event of cardiac or respiratory ARREST Do not call a "code blue"   In the event of cardiac or respiratory ARREST Do not perform Intubation, CPR, defibrillation or ACLS   In the event of cardiac  or respiratory ARREST Use medication by any route, position, wound care, and other measures to relive pain and suffering. May use oxygen, suction and manual treatment of airway obstruction as needed for comfort.      04/14/15 1730    Code Status History    Date Active Date Inactive Code Status Order ID Comments User Context   03/11/2015 1:19 PM 03/13/2015 6:17 PM DNR [id number]  Fritzi Mandes, MD Inpatient   03/10/2015 9:50 PM 03/11/2015 1:19 PM Full Code [id number]  Lytle Butte, MD ED   03/06/2015 4:31 PM 03/07/2015 8:16 PM Full Code [id number]  Hillary Bow, MD ED    Advance Directive Documentation      Most Recent Value   Type of Advance  Directive  Out of facility DNR (pink MOST or yellow form), Healthcare Power of Attorney   Pre-existing out of facility DNR order (yellow form or pink MOST form)     "MOST" Form in Place?        TOTAL TIME TAKING CARE OF THIS PATIENT: 35 minutes.    Vaughan Basta M.D on 04/16/2015 at 11:56 AM  Between 7am to 6pm - Pager - [phone]  After 6pm go to [email or web address] - password [email or web address] [facility]  Office [phone]  CC: Primary care physician; Loistine Chance, MD   Note: This dictation was prepared with Dragon dictation along with smaller phrase technology. Any transcriptional errors that result from this process are unintentional.

## 2015-06-01 ENCOUNTER — Encounter: Payer: Self-pay | Admitting: *Deleted

## 2015-07-27 ENCOUNTER — Ambulatory Visit: Payer: Medicare Other | Admitting: Neurology

## 2016-10-11 IMAGING — MR MR HEAD W/O CM
10 series · 48 of 48 positions shown · non-contrast
Comparison: 04/14/2015 CT.  03/07/2015 MR.

CLINICAL DATA: 74-year-old male with Parkinson's disease and Luc
body dementia presenting with worsening confusion. Subsequent
encounter.

EXAM:
MRI HEAD WITHOUT CONTRAST
TECHNIQUE: Multiplanar, multiecho pulse sequences of the brain and surrounding
structures were obtained without intravenous contrast.

[Series 3: GRE · sagittal · 5.0mm · 0.45mm/px · 4 of 25 slices shown (1 of 2)]
[im 1/25]
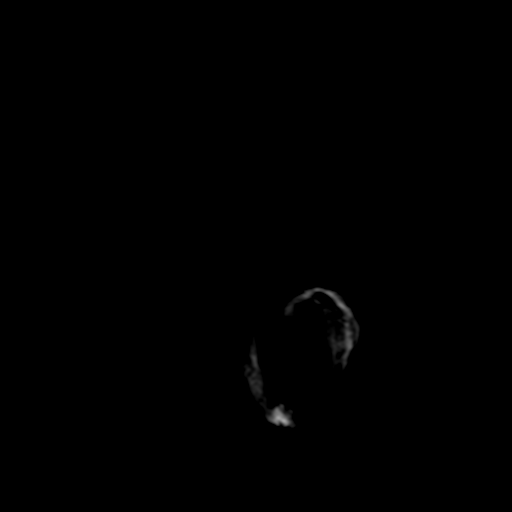
[im 9/25]
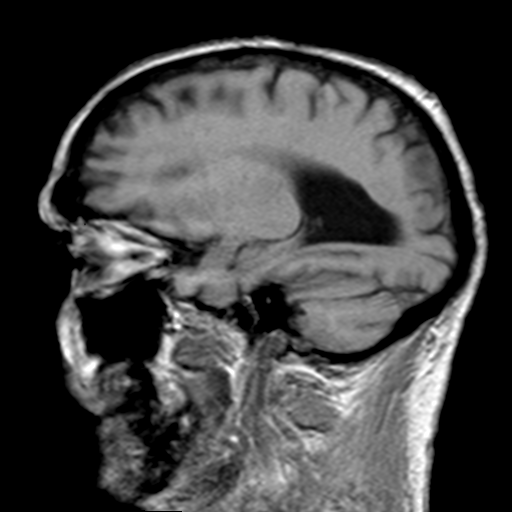
[im 17/25]
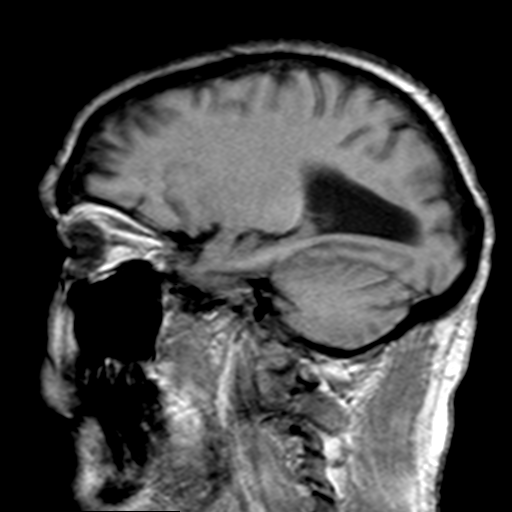
[im 25/25]
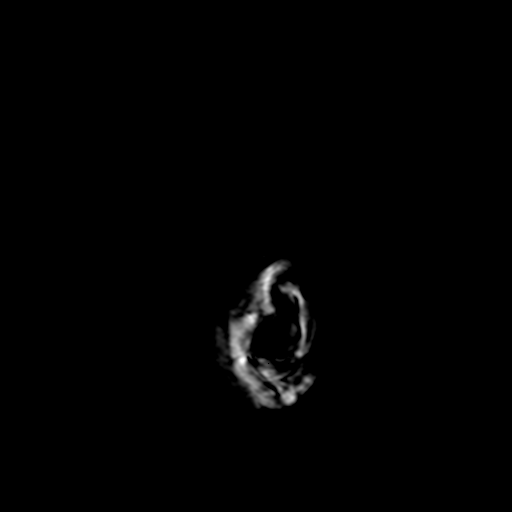

[Series 5: DWI · axial · 3.0mm · 1.80mm/px · z∈[-92,+62]mm · 8 of 54 slices shown (1 of 4)]
[im 1/54]
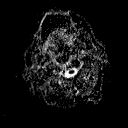
[im 8/54]
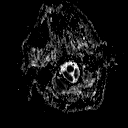
[im 16/54]
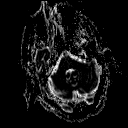
[im 23/54]
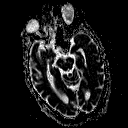
[im 31/54]
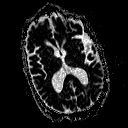
[im 38/54]
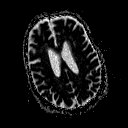
[im 46/54]
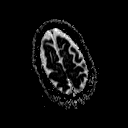
[im 54/54]
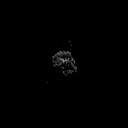

[Series 7: DWI · coronal · 3.0mm · 1.80mm/px · 7 of 49 slices shown (2 of 4)]
[im 1/49]
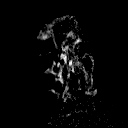
[im 9/49]
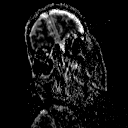
[im 17/49]
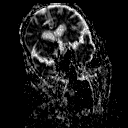
[im 25/49]
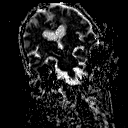
[im 33/49]
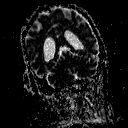
[im 41/49]
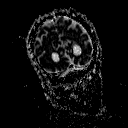
[im 49/49]
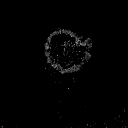

[Series 8: T2 · axial · 5.0mm · 0.45mm/px · z∈[-85,+67]mm · 3 of 26 slices shown (1 of 3)]
[im 1/26]
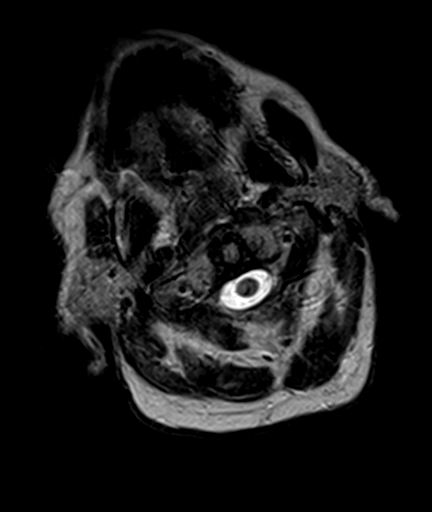
[im 13/26]
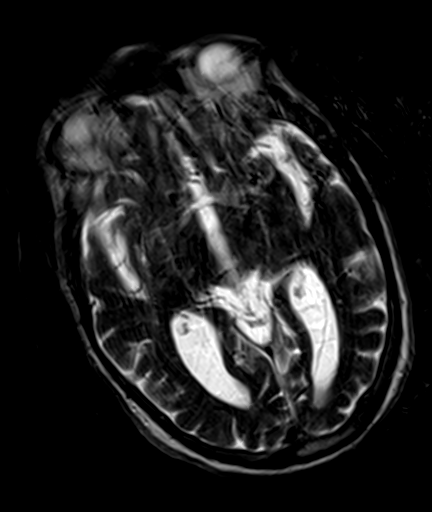
[im 26/26]
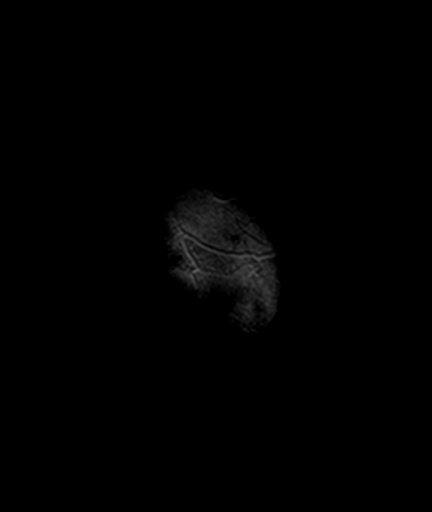

[Series 9: FLAIR · axial · 5.0mm · 0.45mm/px · z∈[-84,+68]mm · 3 of 26 slices shown]
[im 1/26]
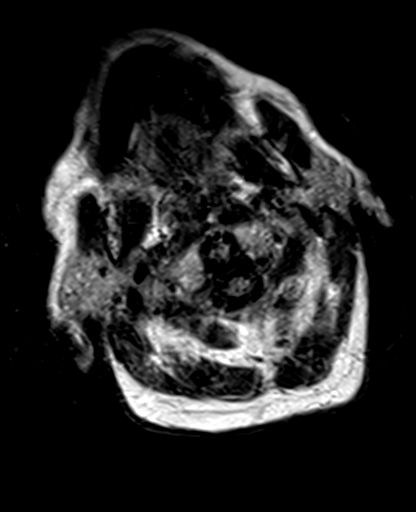
[im 13/26]
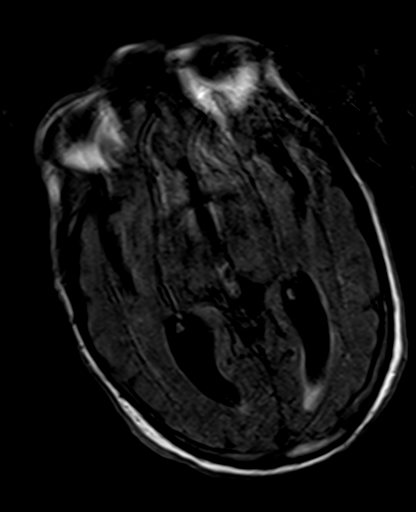
[im 26/26]
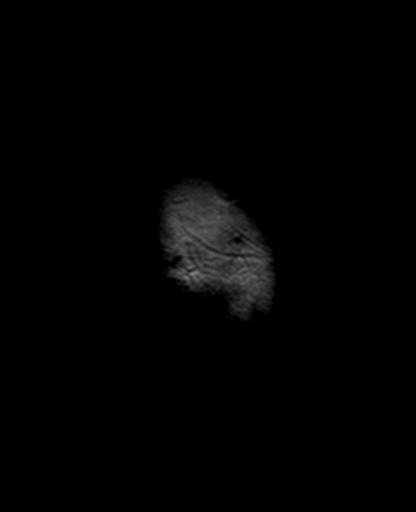

[Series 10: T2 · axial · 5.0mm · 1.20mm/px · z∈[-91,+61]mm · 3 of 26 slices shown (2 of 3)]
[im 1/26]
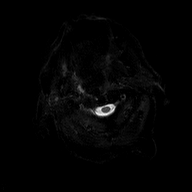
[im 13/26]
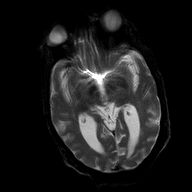
[im 26/26]
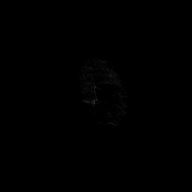

[Series 11: GRE · axial · 5.0mm · 0.45mm/px · z∈[-91,+61]mm · 3 of 26 slices shown (2 of 2)]
[im 1/26]
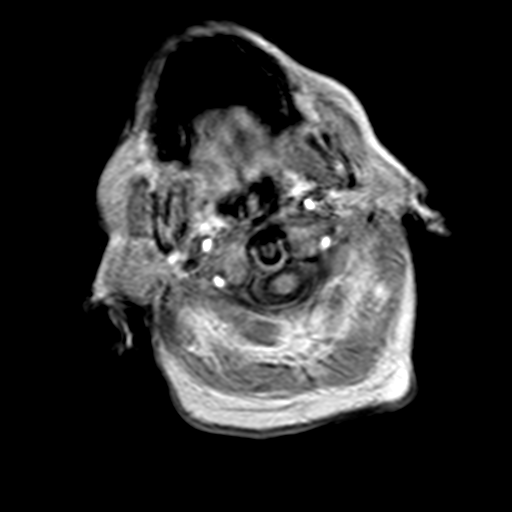
[im 13/26]
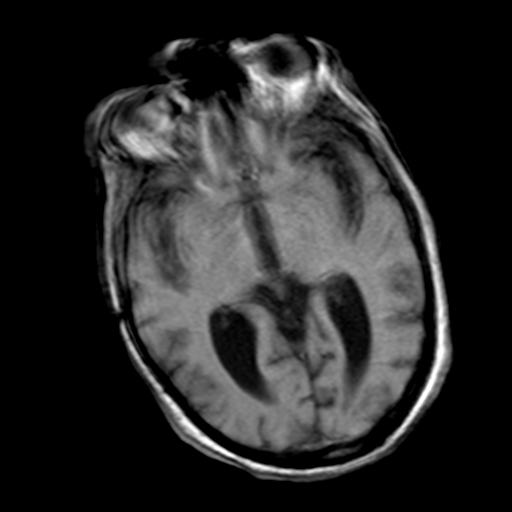
[im 26/26]
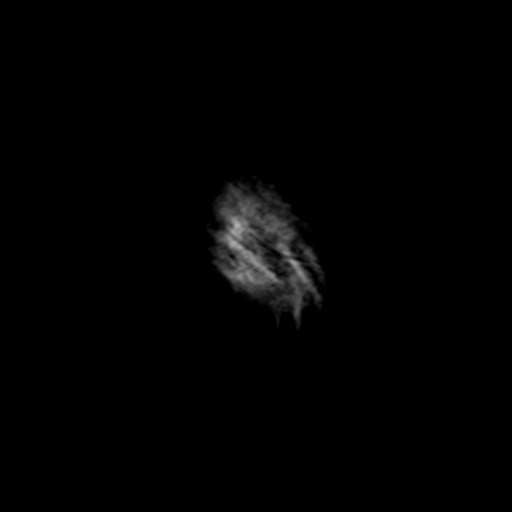

[Series 12: T2 · coronal · 5.0mm · 0.45mm/px · 4 of 29 slices shown (3 of 3)]
[im 1/29]
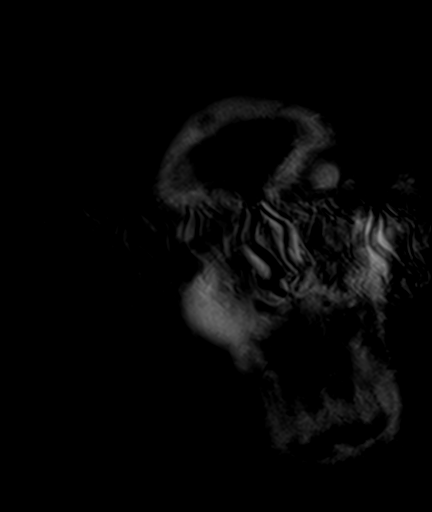
[im 10/29]
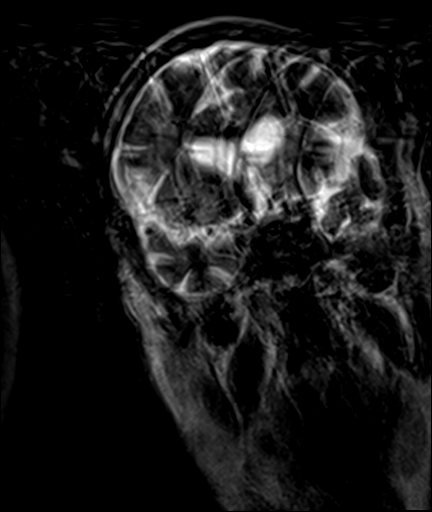
[im 19/29]
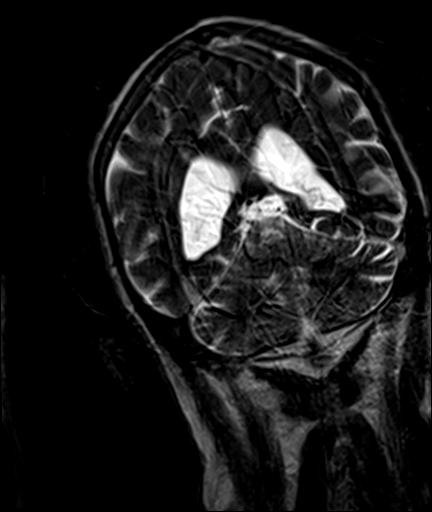
[im 29/29]
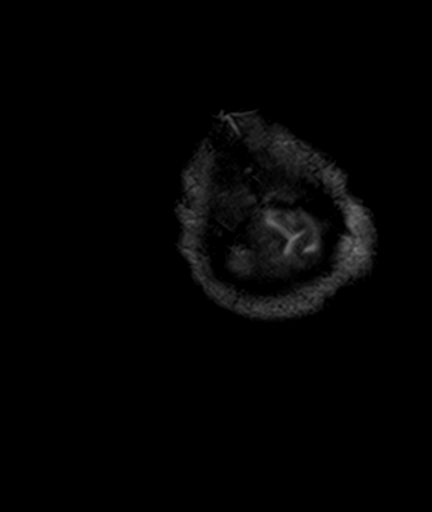

[Series 100: DWI · axial · 3.0mm · 1.80mm/px · z∈[-92,+62]mm · 7 of 56 slices shown (3 of 4)]
[im 1/56]
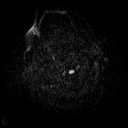
[im 10/56]
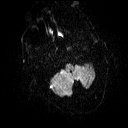
[im 19/56]
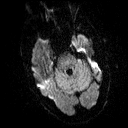
[im 28/56]
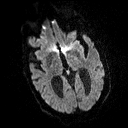
[im 37/56]
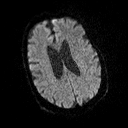
[im 46/56]
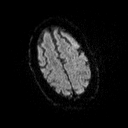
[im 56/56]
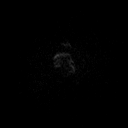

[Series 101: DWI · coronal · 3.0mm · 1.80mm/px · 6 of 49 slices shown (4 of 4)]
[im 1/49]
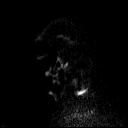
[im 10/49]
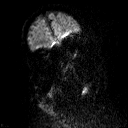
[im 20/49]
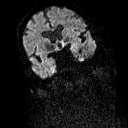
[im 29/49]
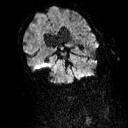
[im 39/49]
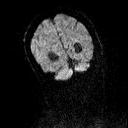
[im 49/49]
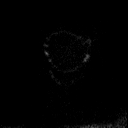

[48 of 48 positions shown; findings below may reference images not displayed]

FINDINGS: Exam is motion degraded.

No acute infarct or intracranial hemorrhage.

Moderate global atrophy. Ventricular prominence stable felt to be
related to atrophy.

Moderate chronic small vessel disease type changes unchanged.

Major intracranial vascular structures are patent.

No obvious mass although evaluation limited by motion.

Mild exophthalmos.

Mild spinal stenosis C3-4 with slight cord flattening. Cervical
medullary junction unremarkable.

Small pituitary gland incidentally noted.
IMPRESSION: Motion degraded exam without acute infarct. Remainder of findings
similar to prior exam as detailed above.
